# Patient Record
Sex: Male | Born: 1955 | Race: Black or African American | Hispanic: No | Marital: Married | Smoking: Never smoker
Health system: Southern US, Community
[De-identification: ages and names within clinical notes are randomized; demographics above are authoritative.]

## PROBLEM LIST (undated history)

## (undated) DIAGNOSIS — G4730 Sleep apnea, unspecified: Secondary | ICD-10-CM

## (undated) DIAGNOSIS — T7840XA Allergy, unspecified, initial encounter: Secondary | ICD-10-CM

## (undated) HISTORY — PX: OTHER SURGICAL HISTORY: SHX169

## (undated) HISTORY — DX: Sleep apnea, unspecified: G47.30

## (undated) HISTORY — DX: Allergy, unspecified, initial encounter: T78.40XA

---

## 2015-08-28 DIAGNOSIS — R42 Dizziness and giddiness: Secondary | ICD-10-CM | POA: Diagnosis not present

## 2016-02-01 DIAGNOSIS — G4733 Obstructive sleep apnea (adult) (pediatric): Secondary | ICD-10-CM | POA: Diagnosis not present

## 2016-04-25 DIAGNOSIS — G4733 Obstructive sleep apnea (adult) (pediatric): Secondary | ICD-10-CM | POA: Diagnosis not present

## 2017-03-06 DIAGNOSIS — E785 Hyperlipidemia, unspecified: Secondary | ICD-10-CM | POA: Diagnosis not present

## 2017-03-06 DIAGNOSIS — R0789 Other chest pain: Secondary | ICD-10-CM | POA: Diagnosis not present

## 2017-03-06 DIAGNOSIS — I200 Unstable angina: Secondary | ICD-10-CM | POA: Diagnosis not present

## 2017-03-06 DIAGNOSIS — Z9114 Patient's other noncompliance with medication regimen: Secondary | ICD-10-CM | POA: Diagnosis not present

## 2017-03-06 DIAGNOSIS — I10 Essential (primary) hypertension: Secondary | ICD-10-CM | POA: Diagnosis not present

## 2017-03-06 DIAGNOSIS — N400 Enlarged prostate without lower urinary tract symptoms: Secondary | ICD-10-CM | POA: Diagnosis not present

## 2017-03-06 DIAGNOSIS — R079 Chest pain, unspecified: Secondary | ICD-10-CM | POA: Diagnosis not present

## 2017-04-20 ENCOUNTER — Ambulatory Visit (INDEPENDENT_AMBULATORY_CARE_PROVIDER_SITE_OTHER): Payer: Medicare Other | Admitting: Nurse Practitioner

## 2017-04-20 ENCOUNTER — Encounter: Payer: Self-pay | Admitting: Nurse Practitioner

## 2017-04-20 VITALS — BP 120/80 | HR 72 | Resp 16 | Ht 64.0 in | Wt 219.6 lb

## 2017-04-20 DIAGNOSIS — G4733 Obstructive sleep apnea (adult) (pediatric): Secondary | ICD-10-CM

## 2017-04-20 NOTE — Progress Notes (Addendum)
[facility] [address] [place], [place] [address]  Internal MEDICINE  Office Visit Note  Patient Name: Bradley Gallagher  [id number]  [id number]  Date of Service: 04/26/2017  Chief Complaint  Patient presents with  . Apnea    uses CPAP at  [facility]    Other  This is a chronic problem. The current episode started more than 1 year ago. The problem occurs constantly. The problem has been gradually improving. Pertinent negatives include no fatigue. Nothing aggravates the symptoms. Treatments tried: CPAP. The treatment provided significant relief.    Pt is here for routine follow up.    Current Medication: Outpatient Encounter Medications as of 04/20/2017  Medication Sig  . cholecalciferol (VITAMIN D) 1000 units tablet Take 1,000 Units by mouth daily.  Marland Kitchen FLUoxetine (PROZAC) 20 MG tablet Take 20 mg by mouth daily.  . simvastatin (ZOCOR) 5 MG tablet Take 5 mg by mouth daily.  . tamsulosin (FLOMAX) 0.4 MG CAPS capsule Take 0.4 mg by mouth. Take 1 tab po daily   No facility-administered encounter medications on file as of 04/20/2017.     Surgical History: History reviewed. No pertinent surgical history.  Medical History: Past Medical History:  Diagnosis Date  . Allergy   . Sleep apnea     Family History: Family History  Problem Relation Age of Onset  . Colon cancer Sister     Social History   Socioeconomic History  . Marital status: Married    Spouse name: Not on file  . Number of children: Not on file  . Years of education: Not on file  . Highest education level: Not on file  Social Needs  . Financial resource strain: Not on file  . Food insecurity - worry: Not on file  . Food insecurity - inability: Not on file  . Transportation needs - medical: Not on file  . Transportation needs - non-medical: Not on file  Occupational History  . Not on file  Tobacco Use  . Smoking status: Never Smoker  . Smokeless tobacco: Never Used  Substance and Sexual Activity   . Alcohol use: No    Frequency: Never  . Drug use: No  . Sexual activity: Not on file  Other Topics Concern  . Not on file  Social History Narrative  . Not on file      Review of Systems  Constitutional: Negative for activity change and fatigue.  HENT: Negative.   Respiratory: Positive for apnea.   Gastrointestinal: Negative.   Endocrine: Negative.   Genitourinary: Negative.   Musculoskeletal: Negative.   Skin: Negative.   Neurological: Negative.   Psychiatric/Behavioral: Negative.     Today's Vitals   04/20/17 1148  BP: 120/80  Pulse: 72  Resp: 16  SpO2: 98%  Weight: 219 lb 9.6 oz (99.6 kg)  Height: 5\' 4"  (1.626 m)    Physical Exam  Constitutional: He is oriented to person, place, and time. He appears well-developed and well-nourished. No distress.  HENT:  Head: Normocephalic and atraumatic.  Mouth/Throat: Oropharynx is clear and moist. No oropharyngeal exudate.  Eyes: EOM are normal. Pupils are equal, round, and reactive to light.  Neck: Normal range of motion. Neck supple. No JVD present. Carotid bruit is not present. No tracheal deviation present. No thyromegaly present.  Cardiovascular: Normal rate, regular rhythm and normal heart sounds. Exam reveals no gallop and no friction rub.  No murmur heard. Pulmonary/Chest: Effort normal and breath sounds normal. No respiratory distress. He has no wheezes. He  has no rales. He exhibits no tenderness.  Abdominal: Soft. Bowel sounds are normal. There is no tenderness.  Musculoskeletal: Normal range of motion.  Lymphadenopathy:    He has no cervical adenopathy.  Neurological: He is alert and oriented to person, place, and time. No cranial nerve deficit.  Skin: Skin is warm and dry. He is not diaphoretic.  Psychiatric: He has a normal mood and affect. His behavior is normal. Judgment and thought content normal.  Nursing note and vitals reviewed.   Assessment/Plan:  1. Obstructive sleep apnea Doing well. Patient  should continue on CPAP of +7cmH2O  General Counseling: Bradley Gallagher verbalizes understanding of the findings of todays visit and agrees with plan of treatment. I have discussed any further diagnostic evaluation that may be needed or ordered today. We also reviewed his medications today. he has been encouraged to call the office with any questions or concerns that should arise related to todays visit.  This patient was seen by Vincent GrosHeather Ayjah Show, FNP- C in Collaboration with Dr Lyndon CodeFozia M Khan as a part of collaborative care agreement    Time spent: 15 Minutes     Dr Lyndon CodeFozia M Khan Internal medicine

## 2017-04-26 DIAGNOSIS — G4733 Obstructive sleep apnea (adult) (pediatric): Secondary | ICD-10-CM | POA: Insufficient documentation

## 2017-06-15 DIAGNOSIS — R0789 Other chest pain: Secondary | ICD-10-CM | POA: Diagnosis not present

## 2017-06-15 DIAGNOSIS — K219 Gastro-esophageal reflux disease without esophagitis: Secondary | ICD-10-CM | POA: Diagnosis not present

## 2017-06-15 DIAGNOSIS — R079 Chest pain, unspecified: Secondary | ICD-10-CM | POA: Diagnosis not present

## 2017-06-15 DIAGNOSIS — I10 Essential (primary) hypertension: Secondary | ICD-10-CM | POA: Diagnosis not present

## 2017-09-04 ENCOUNTER — Encounter: Payer: Self-pay | Admitting: Internal Medicine

## 2017-09-22 ENCOUNTER — Ambulatory Visit (INDEPENDENT_AMBULATORY_CARE_PROVIDER_SITE_OTHER): Payer: Medicare Other | Admitting: Nurse Practitioner

## 2017-09-22 ENCOUNTER — Encounter: Payer: Self-pay | Admitting: Nurse Practitioner

## 2017-09-22 VITALS — BP 122/79 | HR 74 | Resp 16 | Ht 71.0 in | Wt 204.4 lb

## 2017-09-22 DIAGNOSIS — Z0001 Encounter for general adult medical examination with abnormal findings: Secondary | ICD-10-CM | POA: Diagnosis not present

## 2017-09-22 DIAGNOSIS — F4310 Post-traumatic stress disorder, unspecified: Secondary | ICD-10-CM

## 2017-09-22 DIAGNOSIS — I10 Essential (primary) hypertension: Secondary | ICD-10-CM

## 2017-09-22 DIAGNOSIS — R300 Dysuria: Secondary | ICD-10-CM

## 2017-09-22 DIAGNOSIS — G4733 Obstructive sleep apnea (adult) (pediatric): Secondary | ICD-10-CM

## 2017-09-22 NOTE — Progress Notes (Signed)
[facility] [address] [place], [place] [address]  Internal MEDICINE  Office Visit Note  Patient Name: Bradley Gallagher  [id number]  [id number]  Date of Service: 10/15/2017   Pt is here for routine health maintenance examination  Chief Complaint  Patient presents with  . Annual Exam  . Hypertension     The patient is seen on routine basis at [place] due to PTSD. His routine labs are checked at [facility] and he has brought his results for me to review and put into his chart.   Hypertension  This is a chronic problem. The current episode started more than 1 year ago. The problem is unchanged. The problem is controlled. Pertinent negatives include no chest pain, headaches, palpitations or shortness of breath. There are no associated agents to hypertension. Risk factors for coronary artery disease include dyslipidemia and male gender. Past treatments include alpha 1 blockers. The current treatment provides moderate improvement. There are no compliance problems.      Current Medication: Outpatient Encounter Medications as of 09/22/2017  Medication Sig  . ARIPiprazole (ABILIFY) 10 MG tablet Take 10 mg by mouth daily.  Marland Kitchen aspirin (ASPIRIN LOW DOSE) 81 MG tablet Take 81 mg by mouth daily.  . baclofen (LIORESAL) 10 MG tablet Take 10 mg by mouth daily as needed for muscle spasms.  . cholecalciferol (VITAMIN D) 1000 units tablet Take 1,000 Units by mouth daily.  Marland Kitchen Dexlansoprazole (DEXILANT) 30 MG capsule Take 30 mg by mouth daily.  Marland Kitchen escitalopram (LEXAPRO) 20 MG tablet Take 20 mg by mouth daily.  Marland Kitchen FLUoxetine (PROZAC) 20 MG tablet Take 20 mg by mouth daily.  Marland Kitchen lisinopril (PRINIVIL,ZESTRIL) 20 MG tablet Take 20 mg by mouth daily.  . prazosin (MINIPRESS) 5 MG capsule Take 5 mg by mouth at bedtime.  . simvastatin (ZOCOR) 80 MG tablet Take 80 mg by mouth daily.  . tamsulosin (FLOMAX) 0.4 MG CAPS capsule Take 0.4 mg by mouth. Take 1 tab po daily  . traZODone (DESYREL) 100 MG tablet Take 100 mg  by mouth at bedtime.  . [DISCONTINUED] simvastatin (ZOCOR) 5 MG tablet Take 5 mg by mouth daily.   No facility-administered encounter medications on file as of 09/22/2017.     Surgical History: Past Surgical History:  Procedure Laterality Date  . implant in mouth      Medical History: Past Medical History:  Diagnosis Date  . Allergy   . Sleep apnea     Family History: Family History  Problem Relation Age of Onset  . Colon cancer Sister   . Heart attack Mother   . Dementia Mother       Review of Systems  Constitutional: Negative for activity change, fatigue and unexpected weight change.  HENT: Negative for postnasal drip, rhinorrhea, sinus pressure, sinus pain and sore throat.   Eyes: Negative.   Respiratory: Positive for apnea. Negative for shortness of breath and wheezing.        Currently on CPAP   Cardiovascular: Negative for chest pain and palpitations.  Gastrointestinal: Negative for constipation, diarrhea, nausea and vomiting.  Endocrine: Negative for cold intolerance, heat intolerance, polydipsia, polyphagia and polyuria.  Genitourinary: Negative.   Musculoskeletal: Negative for arthralgias, back pain and myalgias.  Skin: Negative.   Allergic/Immunologic: Negative for environmental allergies.  Neurological: Negative for dizziness and headaches.  Hematological: Negative for adenopathy.  Psychiatric/Behavioral: Negative for agitation, confusion, dysphoric mood and sleep disturbance. The patient is not nervous/anxious.      Today's Vitals   09/22/17  0938  BP: 122/79  Pulse: 74  Resp: 16  SpO2: 97%  Weight: 204 lb 6.4 oz (92.7 kg)  Height: 5\' 11"  (1.803 m)    Physical Exam  Constitutional: He is oriented to person, place, and time. He appears well-developed and well-nourished. No distress.  HENT:  Head: Normocephalic and atraumatic.  Nose: Nose normal.  Mouth/Throat: Oropharynx is clear and moist. No oropharyngeal exudate.  Eyes: Pupils are equal,  round, and reactive to light. Conjunctivae and EOM are normal.  Neck: Normal range of motion. Neck supple. No JVD present. Carotid bruit is not present. No tracheal deviation present. No thyromegaly present.  Cardiovascular: Normal rate, regular rhythm, normal heart sounds and intact distal pulses. Exam reveals no gallop and no friction rub.  No murmur heard. Pulmonary/Chest: Effort normal and breath sounds normal. No respiratory distress. He has no wheezes. He has no rales. He exhibits no tenderness.  Abdominal: Soft. Bowel sounds are normal. There is no tenderness.  Musculoskeletal: Normal range of motion.  Lymphadenopathy:    He has no cervical adenopathy.  Neurological: He is alert and oriented to person, place, and time. No cranial nerve deficit.  Skin: Skin is warm and dry. Capillary refill takes less than 2 seconds. He is not diaphoretic.  Psychiatric: He has a normal mood and affect. His behavior is normal. Judgment and thought content normal.  Nursing note and vitals reviewed.  LABS: Recent Results (from the past 2160 hour(s))  Urinalysis, Routine w reflex microscopic     Status: None   Collection Time: 09/22/17 10:54 AM  Result Value Ref Range   Specific Gravity, UA 1.025 1.005 - 1.030   pH, UA 5.5 5.0 - 7.5   Color, UA Yellow Yellow   Appearance Ur Clear Clear   Leukocytes, UA Negative Negative   Protein, UA Negative Negative/Trace   Glucose, UA Negative Negative   Ketones, UA Negative Negative   RBC, UA Negative Negative   Bilirubin, UA Negative Negative   Urobilinogen, Ur 0.2 0.2 - 1.0 mg/dL   Nitrite, UA Negative Negative   Microscopic Examination Comment     Comment: Microscopic not indicated and not performed.    Assessment/Plan: 1. Encounter for general adult medical examination with abnormal findings Annual health maintenance exam today.  2. Essential hypertension, benign Stable. Continue bp medication as prescribed.   3. Obstructive sleep apnea Continue  CPAP and regular visits with Dr. Freda Munro for CPAP management  4. [facility] traumatic stress disorder [facility]  5. Dysuria - Urinalysis, Routine w reflex microscopic  General Counseling: Cathan verbalizes understanding of the findings of todays visit and agrees with plan of treatment. I have discussed any further diagnostic evaluation that may be needed or ordered today. We also reviewed his medications today. he has been encouraged to call the office with any questions or concerns that should arise related to todays visit.    Counseling:  Hypertension Counseling:   The following hypertensive lifestyle modification were recommended and discussed:  1. Limiting alcohol intake to less than 1 oz/day of ethanol:(24 oz of beer or 8 oz of wine or 2 oz of 100-proof whiskey). 2. Take baby ASA 81 mg daily. 3. Importance of regular aerobic exercise and losing weight. 4. Reduce dietary saturated fat and cholesterol intake for overall cardiovascular health. 5. Maintaining adequate dietary potassium, calcium, and magnesium intake. 6. Regular monitoring of the blood pressure. 7. Reduce sodium intake to less than 100 mmol/day (less than  2.3 gm of sodium or less than 6 gm of sodium choride)   This patient was seen by Vincent GrosHeather Kelyn Ponciano FNP Collaboration with Dr Lyndon CodeFozia M Khan as a part of collaborative care agreement   Orders Placed This Encounter  Procedures  . Urinalysis, Routine w reflex microscopic      Time spent: 2030 Minutes      Lyndon CodeFozia M Khan, MD  Internal Medicine

## 2017-09-23 LAB — URINALYSIS, ROUTINE W REFLEX MICROSCOPIC
Bilirubin, UA: NEGATIVE
GLUCOSE, UA: NEGATIVE
KETONES UA: NEGATIVE
Leukocytes, UA: NEGATIVE
NITRITE UA: NEGATIVE
Protein, UA: NEGATIVE
RBC UA: NEGATIVE
SPEC GRAV UA: 1.025 (ref 1.005–1.030)
UUROB: 0.2 mg/dL (ref 0.2–1.0)
pH, UA: 5.5 (ref 5.0–7.5)

## 2017-10-15 DIAGNOSIS — I10 Essential (primary) hypertension: Secondary | ICD-10-CM | POA: Insufficient documentation

## 2017-10-15 DIAGNOSIS — F4310 Post-traumatic stress disorder, unspecified: Secondary | ICD-10-CM | POA: Insufficient documentation

## 2017-10-15 DIAGNOSIS — Z0001 Encounter for general adult medical examination with abnormal findings: Secondary | ICD-10-CM | POA: Insufficient documentation

## 2017-10-15 DIAGNOSIS — R300 Dysuria: Secondary | ICD-10-CM | POA: Insufficient documentation

## 2018-02-12 ENCOUNTER — Encounter: Payer: Self-pay | Admitting: Adult Health

## 2018-02-12 ENCOUNTER — Ambulatory Visit (INDEPENDENT_AMBULATORY_CARE_PROVIDER_SITE_OTHER): Payer: Medicare Other | Admitting: Adult Health

## 2018-02-12 VITALS — BP 130/86 | HR 70 | Resp 16 | Ht 71.0 in | Wt 210.0 lb

## 2018-02-12 DIAGNOSIS — I10 Essential (primary) hypertension: Secondary | ICD-10-CM

## 2018-02-12 DIAGNOSIS — G4733 Obstructive sleep apnea (adult) (pediatric): Secondary | ICD-10-CM

## 2018-02-12 DIAGNOSIS — Z9989 Dependence on other enabling machines and devices: Secondary | ICD-10-CM

## 2018-02-12 DIAGNOSIS — F4310 Post-traumatic stress disorder, unspecified: Secondary | ICD-10-CM | POA: Diagnosis not present

## 2018-02-12 NOTE — Progress Notes (Signed)
[facility] [address] [place], [place] [address]  Pulmonary Sleep Medicine   Office Visit Note  Patient Name: Bradley Gallagher DOB: [birth year] MRN [id number]  Date of Service: 02/12/2018  Complaints/HPI: Pt is here for follow up on OSA.  He reports he wears his CPAP every night. He reports excellent control of his symptoms. He denies excessive sleepiness, fatigue or headaches. He states he went on an overnight trip a few months back, and forgot to bring his CPAP.  He states he felt sleepy and fatigued. He has not forgotten his machine since.  He is very happy with hist treatment and reports he feels much better.  He is cleaning his machine with soap and water. He is in need of a new RX for supplies.       ROS  General: (-) fever, (-) chills, (-) night sweats, (-) weakness Skin: (-) rashes, (-) itching,. Eyes: (-) visual changes, (-) redness, (-) itching. Nose and Sinuses: (-) nasal stuffiness or itchiness, (-) postnasal drip, (-) nosebleeds, (-) sinus trouble. Mouth and Throat: (-) sore throat, (-) hoarseness. Neck: (-) swollen glands, (-) enlarged thyroid, (-) neck pain. Respiratory: - cough, (-) bloody sputum, - shortness of breath, - wheezing. Cardiovascular: - ankle swelling, (-) chest pain. Lymphatic: (-) lymph node enlargement. Neurologic: (-) numbness, (-) tingling. Psychiatric: (-) anxiety, (-) depression   Current Medication: Outpatient Encounter Medications as of 02/12/2018  Medication Sig  . ARIPiprazole (ABILIFY) 10 MG tablet Take 10 mg by mouth daily.  Marland Kitchen aspirin (ASPIRIN LOW DOSE) 81 MG tablet Take 81 mg by mouth daily.  . baclofen (LIORESAL) 10 MG tablet Take 10 mg by mouth daily as needed for muscle spasms.  . cholecalciferol (VITAMIN D) 1000 units tablet Take 1,000 Units by mouth daily.  Marland Kitchen Dexlansoprazole (DEXILANT) 30 MG capsule Take 30 mg by mouth daily.  Marland Kitchen escitalopram (LEXAPRO) 20 MG tablet Take 20 mg by mouth daily.  Marland Kitchen FLUoxetine (PROZAC) 20 MG  tablet Take 20 mg by mouth daily.  Marland Kitchen lisinopril (PRINIVIL,ZESTRIL) 20 MG tablet Take 20 mg by mouth daily.  . prazosin (MINIPRESS) 5 MG capsule Take 5 mg by mouth at bedtime.  . simvastatin (ZOCOR) 80 MG tablet Take 80 mg by mouth daily.  . tamsulosin (FLOMAX) 0.4 MG CAPS capsule Take 0.4 mg by mouth. Take 1 tab po daily  . traZODone (DESYREL) 100 MG tablet Take 100 mg by mouth at bedtime.   No facility-administered encounter medications on file as of 02/12/2018.     Surgical History: Past Surgical History:  Procedure Laterality Date  . implant in mouth      Medical History: Past Medical History:  Diagnosis Date  . Allergy   . Sleep apnea     Family History: Family History  Problem Relation Age of Onset  . Colon cancer Sister   . Heart attack Mother   . Dementia Mother     Social History: Social History   Socioeconomic History  . Marital status: Married    Spouse name: Not on file  . Number of children: Not on file  . Years of education: Not on file  . Highest education level: Not on file  Occupational History  . Not on file  Social Needs  . Financial resource strain: Not on file  . Food insecurity:    Worry: Not on file    Inability: Not on file  . Transportation needs:    Medical: Not on file    Non-medical: Not on  file  Tobacco Use  . Smoking status: Never Smoker  . Smokeless tobacco: Never Used  Substance and Sexual Activity  . Alcohol use: No    Frequency: Never  . Drug use: No  . Sexual activity: Not on file  Lifestyle  . Physical activity:    Days per week: Not on file    Minutes per session: Not on file  . Stress: Not on file  Relationships  . Social connections:    Talks on phone: Not on file    Gets together: Not on file    Attends religious service: Not on file    Active member of club or organization: Not on file    Attends meetings of clubs or organizations: Not on file    Relationship status: Not on file  . Intimate partner  violence:    Fear of current or ex partner: Not on file    Emotionally abused: Not on file    Physically abused: Not on file    Forced sexual activity: Not on file  Other Topics Concern  . Not on file  Social History Narrative  . Not on file    Vital Signs: Blood pressure 130/86, pulse 70, resp. rate 16, height 5\' 11"  (1.803 m), weight 210 lb (95.3 kg), SpO2 98 %.  Examination: General Appearance: The patient is well-developed, well-nourished, and in no distress. Skin: Gross inspection of skin unremarkable. Head: normocephalic, no gross deformities. Eyes: no gross deformities noted. ENT: ears appear grossly normal no exudates. Neck: Supple. No thyromegaly. No LAD. Respiratory: Clear to auscultation bilat. Cardiovascular: Normal S1 and S2 without murmur or rub. Extremities: No cyanosis. pulses are equal. Neurologic: Alert and oriented. No involuntary movements.  LABS: No results found for this or any previous visit (from the past 2160 hour(s)).  Radiology: Patient was never admitted.  No results found.  No results found.    Assessment and Plan: Patient Active Problem List   Diagnosis Date Noted  . Encounter for general adult medical examination with abnormal findings 10/15/2017  . Essential hypertension, benign 10/15/2017  . Post traumatic stress disorder 10/15/2017  . Dysuria 10/15/2017  . Obstructive sleep apnea 04/26/2017    1. OSA on CPAP Encouraged patient to continue to use CPAP.  Will send RX for new supplies. Follow up in 6 months.   2. Essential hypertension, benign Stable, continue current medications  3. [facility]   General Counseling: I have discussed the findings of the evaluation and examination with Bradley Gallagher.  I have also discussed any further diagnostic evaluation thatmay be needed or ordered today. Bradley Gallagher verbalizes understanding of the findings of todays visit. We also reviewed his medications  today and discussed drug interactions and side effects including but not limited excessive drowsiness and altered mental states. We also discussed that there is always a risk not just to him but also people around him. he has been encouraged to call the office with any questions or concerns that should arise related to todays visit.    Time spent: 25 This patient was seen by Blima LedgerAdam Zoelle Markus AGNP-C in Collaboration with Dr. Freda MunroSaadat Khan as a part of collaborative care agreement.   I have personally obtained a history, examined the patient, evaluated laboratory and imaging results, formulated the assessment and plan and placed orders.    Yevonne PaxSaadat A Khan, MD [facility] Pulmonary and Critical Care Sleep medicine

## 2018-02-12 NOTE — Patient Instructions (Signed)

## 2018-04-20 ENCOUNTER — Ambulatory Visit (INDEPENDENT_AMBULATORY_CARE_PROVIDER_SITE_OTHER): Payer: Medicare Other | Admitting: Adult Health

## 2018-04-20 ENCOUNTER — Encounter: Payer: Self-pay | Admitting: Nurse Practitioner

## 2018-04-20 VITALS — BP 130/80 | HR 78 | Resp 16 | Ht 71.0 in | Wt 217.0 lb

## 2018-04-20 DIAGNOSIS — G4733 Obstructive sleep apnea (adult) (pediatric): Secondary | ICD-10-CM

## 2018-04-20 DIAGNOSIS — Z9989 Dependence on other enabling machines and devices: Secondary | ICD-10-CM | POA: Diagnosis not present

## 2018-04-20 DIAGNOSIS — F4310 Post-traumatic stress disorder, unspecified: Secondary | ICD-10-CM | POA: Diagnosis not present

## 2018-04-20 DIAGNOSIS — I10 Essential (primary) hypertension: Secondary | ICD-10-CM

## 2018-04-20 NOTE — Patient Instructions (Signed)

## 2018-04-20 NOTE — Progress Notes (Signed)
[facility] [address] [place], [place] [address]  Pulmonary Sleep Medicine   Office Visit Note  Patient Name: Bradley Gallagher DOB: 01/28/1956 MRN [id number]  Date of Service: 04/20/2018  Complaints/HPI: Pt is here for follow up on osa.  He reports excellent control of his OSA symptoms using his CPAP.  He is religiously using his CPAP every night.  He is cleaning his dream but reports that it is very time consuming and he is ready to buy a so clean machine to assist him with this.  He changes supplies out as directed and overall is very happy with the results from his sectioning.  He denies any sinus issues or infections.  He denies any chest pain, dizziness, or shortness of breath.  ROS  General: (-) fever, (-) chills, (-) night sweats, (-) weakness Skin: (-) rashes, (-) itching,. Eyes: (-) visual changes, (-) redness, (-) itching. Nose and Sinuses: (-) nasal stuffiness or itchiness, (-) postnasal drip, (-) nosebleeds, (-) sinus trouble. Mouth and Throat: (-) sore throat, (-) hoarseness. Neck: (-) swollen glands, (-) enlarged thyroid, (-) neck pain. Respiratory: - cough, (-) bloody sputum, - shortness of breath, - wheezing. Cardiovascular: - ankle swelling, (-) chest pain. Lymphatic: (-) lymph node enlargement. Neurologic: (-) numbness, (-) tingling. Psychiatric: (-) anxiety, (-) depression   Current Medication: Outpatient Encounter Medications as of 04/20/2018  Medication Sig  . ARIPiprazole (ABILIFY) 10 MG tablet Take 10 mg by mouth daily.  Marland Kitchen aspirin (ASPIRIN LOW DOSE) 81 MG tablet Take 81 mg by mouth daily.  . cholecalciferol (VITAMIN D) 1000 units tablet Take 1,000 Units by mouth daily.  Marland Kitchen Dexlansoprazole (DEXILANT) 30 MG capsule Take 30 mg by mouth daily.  Marland Kitchen escitalopram (LEXAPRO) 20 MG tablet Take 20 mg by mouth daily.  Marland Kitchen FLUoxetine (PROZAC) 20 MG tablet Take 20 mg by mouth daily.  Marland Kitchen lisinopril (PRINIVIL,ZESTRIL) 20 MG tablet Take 20 mg by mouth daily.  .  prazosin (MINIPRESS) 5 MG capsule Take 5 mg by mouth at bedtime.  . simvastatin (ZOCOR) 80 MG tablet Take 80 mg by mouth daily.  . tamsulosin (FLOMAX) 0.4 MG CAPS capsule Take 0.4 mg by mouth. Take 1 tab po daily  . traZODone (DESYREL) 100 MG tablet Take 100 mg by mouth at bedtime.  . baclofen (LIORESAL) 10 MG tablet Take 10 mg by mouth daily as needed for muscle spasms.   No facility-administered encounter medications on file as of 04/20/2018.     Surgical History: Past Surgical History:  Procedure Laterality Date  . implant in mouth      Medical History: Past Medical History:  Diagnosis Date  . Allergy   . Sleep apnea     Family History: Family History  Problem Relation Age of Onset  . Colon cancer Sister   . Heart attack Mother   . Dementia Mother     Social History: Social History   Socioeconomic History  . Marital status: Married    Spouse name: Not on file  . Number of children: Not on file  . Years of education: Not on file  . Highest education level: Not on file  Occupational History  . Not on file  Social Needs  . Financial resource strain: Not on file  . Food insecurity:    Worry: Not on file    Inability: Not on file  . Transportation needs:    Medical: Not on file    Non-medical: Not on file  Tobacco Use  . Smoking status:  Never Smoker  . Smokeless tobacco: Never Used  Substance and Sexual Activity  . Alcohol use: No    Frequency: Never  . Drug use: No  . Sexual activity: Not on file  Lifestyle  . Physical activity:    Days per week: Not on file    Minutes per session: Not on file  . Stress: Not on file  Relationships  . Social connections:    Talks on phone: Not on file    Gets together: Not on file    Attends religious service: Not on file    Active member of club or organization: Not on file    Attends meetings of clubs or organizations: Not on file    Relationship status: Not on file  . Intimate partner violence:    Fear of current  or ex partner: Not on file    Emotionally abused: Not on file    Physically abused: Not on file    Forced sexual activity: Not on file  Other Topics Concern  . Not on file  Social History Narrative  . Not on file    Vital Signs: Blood pressure 130/80, pulse 78, resp. rate 16, height 5\' 11"  (1.803 m), weight 217 lb (98.4 kg), SpO2 98 %.  Examination: General Appearance: The patient is well-developed, well-nourished, and in no distress. Skin: Gross inspection of skin unremarkable. Head: normocephalic, no gross deformities. Eyes: no gross deformities noted. ENT: ears appear grossly normal no exudates. Neck: Supple. No thyromegaly. No LAD. Respiratory: clear bilaterally. Cardiovascular: Normal S1 and S2 without murmur or rub. Extremities: No cyanosis. pulses are equal. Neurologic: Alert and oriented. No involuntary movements.  LABS: No results found for this or any previous visit (from the past 2160 hour(s)).  Radiology: Patient was never admitted.  No results found.  No results found.    Assessment and Plan: Patient Active Problem List   Diagnosis Date Noted  . Encounter for general adult medical examination with abnormal findings 10/15/2017  . Essential hypertension, benign 10/15/2017  . Post traumatic stress disorder 10/15/2017  . Dysuria 10/15/2017  . Obstructive sleep apnea 04/26/2017    1. OSA on CPAP Continue CPAP nightly and napping.  Continue to clean machine and what ever modality is best for him.  Also encourage patient to continue changing supplies as directed.  Patient follow-up in 1 year.  2. Essential hypertension, benign Stable, continue current therapy.  3. Post traumatic stress disorder Stable, continue CVA.  General Counseling: I have discussed the findings of the evaluation and examination with Kevin Fenton.  I have also discussed any further diagnostic evaluation thatmay be needed or ordered today. Ziya verbalizes understanding of the findings of  todays visit. We also reviewed his medications today and discussed drug interactions and side effects including but not limited excessive drowsiness and altered mental states. We also discussed that there is always a risk not just to him but also people around him. he has been encouraged to call the office with any questions or concerns that should arise related to todays visit.    Time spent: 25  I have personally obtained a history, examined the patient, evaluated laboratory and imaging results, formulated the assessment and plan and placed orders.    Yevonne Pax, MD [facility] Pulmonary and Critical Care Sleep medicine

## 2018-05-02 DIAGNOSIS — I10 Essential (primary) hypertension: Secondary | ICD-10-CM | POA: Diagnosis not present

## 2018-05-02 DIAGNOSIS — R29700 NIHSS score 0: Secondary | ICD-10-CM | POA: Diagnosis not present

## 2018-05-02 DIAGNOSIS — K219 Gastro-esophageal reflux disease without esophagitis: Secondary | ICD-10-CM | POA: Diagnosis not present

## 2018-05-02 DIAGNOSIS — R51 Headache: Secondary | ICD-10-CM | POA: Diagnosis not present

## 2018-07-02 DIAGNOSIS — J309 Allergic rhinitis, unspecified: Secondary | ICD-10-CM | POA: Diagnosis not present

## 2018-08-13 ENCOUNTER — Ambulatory Visit: Payer: Self-pay | Admitting: Adult Health

## 2018-09-27 DIAGNOSIS — E785 Hyperlipidemia, unspecified: Secondary | ICD-10-CM | POA: Diagnosis not present

## 2018-09-27 DIAGNOSIS — R079 Chest pain, unspecified: Secondary | ICD-10-CM | POA: Diagnosis not present

## 2018-09-27 DIAGNOSIS — I10 Essential (primary) hypertension: Secondary | ICD-10-CM | POA: Diagnosis not present

## 2018-09-27 DIAGNOSIS — R072 Precordial pain: Secondary | ICD-10-CM | POA: Diagnosis not present

## 2018-12-26 DIAGNOSIS — M62838 Other muscle spasm: Secondary | ICD-10-CM | POA: Diagnosis not present

## 2018-12-26 DIAGNOSIS — F17210 Nicotine dependence, cigarettes, uncomplicated: Secondary | ICD-10-CM | POA: Diagnosis not present

## 2018-12-26 DIAGNOSIS — R079 Chest pain, unspecified: Secondary | ICD-10-CM | POA: Diagnosis not present

## 2018-12-26 DIAGNOSIS — I10 Essential (primary) hypertension: Secondary | ICD-10-CM | POA: Diagnosis not present

## 2019-01-30 ENCOUNTER — Ambulatory Visit (INDEPENDENT_AMBULATORY_CARE_PROVIDER_SITE_OTHER): Payer: Medicare Other

## 2019-01-30 ENCOUNTER — Other Ambulatory Visit: Payer: Self-pay

## 2019-01-30 DIAGNOSIS — G4733 Obstructive sleep apnea (adult) (pediatric): Secondary | ICD-10-CM | POA: Diagnosis not present

## 2019-01-30 NOTE — Progress Notes (Signed)
95 percentile pressure 7   95th percentile leak 5.8   apnea index 0.3 /hr  apnea-hypopnea index  0.6 /hr   total days used  >4 hr 64 days  total days used <4 hr 0 days  Total compliance 71 percent  He is doing great wearing cpap encouraged to wear a little more compliance is borderline at 71%

## 2019-02-14 ENCOUNTER — Telehealth: Payer: Self-pay

## 2019-02-14 NOTE — Telephone Encounter (Signed)
Called lmom informing patient of appointment. [facility] 

## 2019-02-14 NOTE — Telephone Encounter (Signed)
Pt called and confirmed appt for 02/18/19. Bradley Gallagher

## 2019-02-18 ENCOUNTER — Other Ambulatory Visit: Payer: Self-pay

## 2019-02-18 ENCOUNTER — Encounter: Payer: Self-pay | Admitting: Internal Medicine

## 2019-02-18 ENCOUNTER — Ambulatory Visit (INDEPENDENT_AMBULATORY_CARE_PROVIDER_SITE_OTHER): Payer: Medicare Other | Admitting: Internal Medicine

## 2019-02-18 VITALS — BP 140/80 | HR 85 | Temp 97.7°F | Resp 16 | Ht 71.0 in | Wt 215.0 lb

## 2019-02-18 DIAGNOSIS — I10 Essential (primary) hypertension: Secondary | ICD-10-CM

## 2019-02-18 DIAGNOSIS — Z9989 Dependence on other enabling machines and devices: Secondary | ICD-10-CM

## 2019-02-18 DIAGNOSIS — F4310 Post-traumatic stress disorder, unspecified: Secondary | ICD-10-CM

## 2019-02-18 DIAGNOSIS — G4733 Obstructive sleep apnea (adult) (pediatric): Secondary | ICD-10-CM | POA: Diagnosis not present

## 2019-02-18 NOTE — Progress Notes (Signed)
[facility] [address] [place], [place] [address]  Pulmonary Sleep Medicine   Office Visit Note  Patient Name: Bradley Gallagher DOB: 1955/05/12 MRN [id number]  Date of Service: 02/18/2019  Complaints/HPI: Pt is here for one year follow up.  His recent compliance shows 71%.  He does report taking a day off here and there.  We discussed the importance of compliance. He is cleaning machine by hand, and changing filters and tubing as directed. Denies headaches, sinus issues, palpitations, or hemoptysis.  He is in need of a new RX for supplies.   ROS  General: (-) fever, (-) chills, (-) night sweats, (-) weakness Skin: (-) rashes, (-) itching,. Eyes: (-) visual changes, (-) redness, (-) itching. Nose and Sinuses: (-) nasal stuffiness or itchiness, (-) postnasal drip, (-) nosebleeds, (-) sinus trouble. Mouth and Throat: (-) sore throat, (-) hoarseness. Neck: (-) swollen glands, (-) enlarged thyroid, (-) neck pain. Respiratory: - cough, (-) bloody sputum, - shortness of breath, - wheezing. Cardiovascular: - ankle swelling, (-) chest pain. Lymphatic: (-) lymph node enlargement. Neurologic: (-) numbness, (-) tingling. Psychiatric: (-) anxiety, (-) depression   Current Medication: Outpatient Encounter Medications as of 02/18/2019  Medication Sig  . ARIPiprazole (ABILIFY) 10 MG tablet Take 10 mg by mouth daily.  Marland Kitchen aspirin (ASPIRIN LOW DOSE) 81 MG tablet Take 81 mg by mouth daily.  . cholecalciferol (VITAMIN D) 1000 units tablet Take 1,000 Units by mouth daily.  Marland Kitchen Dexlansoprazole (DEXILANT) 30 MG capsule Take 30 mg by mouth daily.  Marland Kitchen escitalopram (LEXAPRO) 20 MG tablet Take 20 mg by mouth daily.  Marland Kitchen lisinopril (PRINIVIL,ZESTRIL) 20 MG tablet Take 20 mg by mouth daily.  . prazosin (MINIPRESS) 5 MG capsule Take 5 mg by mouth at bedtime.  . simvastatin (ZOCOR) 80 MG tablet Take 80 mg by mouth daily.  . tamsulosin (FLOMAX) 0.4 MG CAPS capsule Take 0.4 mg by mouth. Take 1 tab po  daily  . traZODone (DESYREL) 100 MG tablet Take 100 mg by mouth at bedtime.  . [DISCONTINUED] baclofen (LIORESAL) 10 MG tablet Take 10 mg by mouth daily as needed for muscle spasms.  . [DISCONTINUED] FLUoxetine (PROZAC) 20 MG tablet Take 20 mg by mouth daily.   No facility-administered encounter medications on file as of 02/18/2019.     Surgical History: Past Surgical History:  Procedure Laterality Date  . implant in mouth      Medical History: Past Medical History:  Diagnosis Date  . Allergy   . Sleep apnea     Family History: Family History  Problem Relation Age of Onset  . Colon cancer Sister   . Heart attack Mother   . Dementia Mother     Social History: Social History   Socioeconomic History  . Marital status: Married    Spouse name: Not on file  . Number of children: Not on file  . Years of education: Not on file  . Highest education level: Not on file  Occupational History  . Not on file  Social Needs  . Financial resource strain: Not on file  . Food insecurity    Worry: Not on file    Inability: Not on file  . Transportation needs    Medical: Not on file    Non-medical: Not on file  Tobacco Use  . Smoking status: Never Smoker  . Smokeless tobacco: Never Used  Substance and Sexual Activity  . Alcohol use: No    Frequency: Never  . Drug use: No  .  Sexual activity: Not on file  Lifestyle  . Physical activity    Days per week: Not on file    Minutes per session: Not on file  . Stress: Not on file  Relationships  . Social Herbalist on phone: Not on file    Gets together: Not on file    Attends religious service: Not on file    Active member of club or organization: Not on file    Attends meetings of clubs or organizations: Not on file    Relationship status: Not on file  . Intimate partner violence    Fear of current or ex partner: Not on file    Emotionally abused: Not on file    Physically abused: Not on file    Forced sexual  activity: Not on file  Other Topics Concern  . Not on file  Social History Narrative  . Not on file    Vital Signs: Blood pressure 140/80, pulse 85, temperature 97.7 F (36.5 C), resp. rate 16, height 5\' 11"  (1.803 m), weight 215 lb (97.5 kg), SpO2 99 %.  Examination: General Appearance: The patient is well-developed, well-nourished, and in no distress. Skin: Gross inspection of skin unremarkable. Head: normocephalic, no gross deformities. Eyes: no gross deformities noted. ENT: ears appear grossly normal no exudates. Neck: Supple. No thyromegaly. No LAD. Respiratory: clear bilaterally. Cardiovascular: Normal S1 and S2 without murmur or rub. Extremities: No cyanosis. pulses are equal. Neurologic: Alert and oriented. No involuntary movements.  LABS: No results found for this or any previous visit (from the past 2160 hour(s)).  Radiology: Patient was never admitted.  No results found.  No results found.    Assessment and Plan: Patient Active Problem List   Diagnosis Date Noted  . Encounter for general adult medical examination with abnormal findings 10/15/2017  . Essential hypertension, benign 10/15/2017  . Post traumatic stress disorder 10/15/2017  . Dysuria 10/15/2017  . Obstructive sleep apnea 04/26/2017    1. OSA on CPAP Excellent relief of symptoms.  Discussed good cpap compliance with patient, and its importance.  He verbalized understanding.  Pt will continue to use cpap.    2. Essential hypertension, benign Stable, continue present management.   3. Post traumatic stress disorder [facility].  Well controlled currently.   General Counseling: I have discussed the findings of the evaluation and examination with Awanda Mink.  I have also discussed any further diagnostic evaluation thatmay be needed or ordered today. Sheikh verbalizes understanding of the findings of todays visit. We also reviewed his medications today and discussed drug interactions  and side effects including but not limited excessive drowsiness and altered mental states. We also discussed that there is always a risk not just to him but also people around him. he has been encouraged to call the office with any questions or concerns that should arise related to todays visit.  No orders of the defined types were placed in this encounter.    Time spent: 25  I have personally obtained a history, examined the patient, evaluated laboratory and imaging results, formulated the assessment and plan and placed orders.    Allyne Gee, MD [facility] Pulmonary and Critical Care Sleep medicine

## 2019-02-19 ENCOUNTER — Telehealth: Payer: Self-pay

## 2019-02-19 NOTE — Telephone Encounter (Signed)
Faxed cpap RX order supply to [facility]. Bradley Gallagher

## 2019-07-31 ENCOUNTER — Other Ambulatory Visit: Payer: Self-pay

## 2019-07-31 ENCOUNTER — Ambulatory Visit (INDEPENDENT_AMBULATORY_CARE_PROVIDER_SITE_OTHER): Payer: Medicare Other

## 2019-07-31 DIAGNOSIS — G4733 Obstructive sleep apnea (adult) (pediatric): Secondary | ICD-10-CM | POA: Diagnosis not present

## 2019-07-31 NOTE — Progress Notes (Signed)
95 percentile pressure 7   95th percentile leak 2.7   apnea index 0.3 /hr  apnea-hypopnea index  0.7 /hr   total days used  >4 hr 90 days  total days used <4 hr 0 days  Total compliance 100 percent  He is doing great on cpap no problems or questions at this time. He does want to see Dr Welton Flakes for pulmonary to discuss the [facility] visits

## 2019-08-13 ENCOUNTER — Other Ambulatory Visit: Payer: Self-pay | Admitting: Internal Medicine

## 2019-08-13 DIAGNOSIS — R0602 Shortness of breath: Secondary | ICD-10-CM

## 2019-08-19 ENCOUNTER — Ambulatory Visit: Payer: Medicare Other | Admitting: Internal Medicine

## 2019-08-21 ENCOUNTER — Ambulatory Visit (INDEPENDENT_AMBULATORY_CARE_PROVIDER_SITE_OTHER): Payer: Medicare Other | Admitting: Internal Medicine

## 2019-08-21 ENCOUNTER — Other Ambulatory Visit: Payer: Self-pay

## 2019-08-21 DIAGNOSIS — R0602 Shortness of breath: Secondary | ICD-10-CM | POA: Diagnosis not present

## 2019-08-21 LAB — PULMONARY FUNCTION TEST

## 2019-09-01 NOTE — Procedures (Signed)
[facility] [address] [place], [address]  DATE OF SERVICE: Aug 21, 2019  Complete Pulmonary Function Testing Interpretation:  FINDINGS:  Forced vital capacity is normal FEV1 is 2.39 L which is 79% of predicted and is mildly decreased.  FEV1 FVC ratio is mildly decreased.  Postbronchodilator there is no significant improvement in the FEV1 clinical improvement may occur in the absence of spirometric improvement.  Total lung capacity is mildly decreased residual volume mildly decreased residual internal capacity ratio is FRC is moderately decreased.  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  There is no response to bronchodilators clinical response may still occur in the absence of spirometric response.  Yevonne Pax, MD [facility] Pulmonary Critical Care Medicine Sleep Medicine

## 2019-09-02 ENCOUNTER — Other Ambulatory Visit: Payer: Self-pay

## 2019-09-02 ENCOUNTER — Ambulatory Visit (INDEPENDENT_AMBULATORY_CARE_PROVIDER_SITE_OTHER): Payer: Medicare Other | Admitting: Internal Medicine

## 2019-09-02 ENCOUNTER — Encounter: Payer: Self-pay | Admitting: Internal Medicine

## 2019-09-02 VITALS — BP 123/74 | HR 74 | Temp 97.6°F | Resp 16 | Ht 71.0 in | Wt 215.2 lb

## 2019-09-02 DIAGNOSIS — R058 Other specified cough: Secondary | ICD-10-CM

## 2019-09-02 DIAGNOSIS — G4733 Obstructive sleep apnea (adult) (pediatric): Secondary | ICD-10-CM

## 2019-09-02 DIAGNOSIS — R05 Cough: Secondary | ICD-10-CM | POA: Diagnosis not present

## 2019-09-02 DIAGNOSIS — I10 Essential (primary) hypertension: Secondary | ICD-10-CM

## 2019-09-02 DIAGNOSIS — Z9989 Dependence on other enabling machines and devices: Secondary | ICD-10-CM

## 2019-09-02 DIAGNOSIS — J42 Unspecified chronic bronchitis: Secondary | ICD-10-CM | POA: Diagnosis not present

## 2019-09-02 NOTE — Progress Notes (Signed)
[facility] [place], [place] Gall[place] [address]  Pulmonary Sleep Medicine   Office Visit Note  Patient Name: Bradley Gallagher DOB: February 21, 1956 MRN [id number]  Date of Service: 09/02/2019  Complaints/HPI: Pt is here for follow up.  He reports nagging mild cough for over a few months.  He initially thought it was his allergies.  He does take lisinopril daily.  He reports the [place] diagnosed him with COPD.  He had a PFt that shows Mild restrictive disease. Pt reports good compliance with CPAP therapy. Cleaning machine by hand, and changing filters and tubing as directed. Denies headaches, sinus issues, palpitations, or hemoptysis.      ROS  General: (-) fever, (-) chills, (-) night sweats, (-) weakness Skin: (-) rashes, (-) itching,. Eyes: (-) visual changes, (-) redness, (-) itching. Nose and Sinuses: (-) nasal stuffiness or itchiness, (-) postnasal drip, (-) nosebleeds, (-) sinus trouble. Mouth and Throat: (-) sore throat, (-) hoarseness. Neck: (-) swollen glands, (-) enlarged thyroid, (-) neck pain. Respiratory: - cough, (-) bloody sputum, - shortness of breath, - wheezing. Cardiovascular: - ankle swelling, (-) chest pain. Lymphatic: (-) lymph node enlargement. Neurologic: (-) numbness, (-) tingling. Psychiatric: (-) anxiety, (-) depression   Current Medication: Outpatient Encounter Medications as of 09/02/2019  Medication Sig   ARIPiprazole (ABILIFY) 10 MG tablet Take 10 mg by mouth daily.   aspirin (ASPIRIN LOW DOSE) 81 MG tablet Take 81 mg by mouth daily.   cholecalciferol (VITAMIN D) 1000 units tablet Take 1,000 Units by mouth daily.   Dexlansoprazole (DEXILANT) 30 MG capsule Take 30 mg by mouth daily.   escitalopram (LEXAPRO) 20 MG tablet Take 20 mg by mouth daily.   lisinopril (PRINIVIL,ZESTRIL) 20 MG tablet Take 20 mg by mouth daily.   prazosin (MINIPRESS) 5 MG capsule Take 5 mg by mouth at bedtime.   simvastatin (ZOCOR) 80 MG tablet Take 80 mg by mouth  daily.   tamsulosin (FLOMAX) 0.4 MG CAPS capsule Take 0.4 mg by mouth. Take 1 tab po daily   traZODone (DESYREL) 100 MG tablet Take 100 mg by mouth at bedtime.   No facility-administered encounter medications on file as of 09/02/2019.    Surgical History: Past Surgical History:  Procedure Laterality Date   implant in mouth      Medical History: Past Medical History:  Diagnosis Date   Allergy    Sleep apnea     Family History: Family History  Problem Relation Age of Onset   Colon cancer Sister    Heart attack Mother    Dementia Mother     Social History: Social History   Socioeconomic History   Marital status: Married    Spouse name: Not on file   Number of children: Not on file   Years of education: Not on file   Highest education level: Not on file  Occupational History   Not on file  Tobacco Use   Smoking status: Never Smoker   Smokeless tobacco: Never Used  Substance and Sexual Activity   Alcohol use: No   Drug use: No   Sexual activity: Not on file  Other Topics Concern   Not on file  Social History Narrative   Not on file   Social Determinants of Health   Financial Resource Strain:    Difficulty of Paying Living Expenses:   Food Insecurity:    Worried About Bradley Gallagher in the Last Year:    Bradley Gallagher in the Last Year:  Transportation Needs:    Freight forwarder (Medical):    Lack of Transportation (Non-Medical):   Physical Activity:    Days of Exercise per Week:    Minutes of Exercise per Session:   Stress:    Feeling of Stress :   Social Connections:    Frequency of Communication with Friends and Family:    Frequency of Social Gatherings with Friends and Family:    Attends Religious Services:    Active Member of Clubs or Organizations:    Attends Engineer, structural:    Marital Status:   Intimate Partner Violence:    Fear of Current or Ex-Partner:    Emotionally Abused:     Physically Abused:    Sexually Abused:     Vital Signs: Blood pressure 123/74, pulse 74, temperature 97.6 F (36.4 C), resp. rate 16, height 5\' 11"  (1.803 m), weight 215 lb 3.2 oz (97.6 kg), SpO2 97 %.  Examination: General Appearance: The patient is well-developed, well-nourished, and in no distress. Skin: Gross inspection of skin unremarkable. Head: normocephalic, no gross deformities. Eyes: no gross deformities noted. ENT: ears appear grossly normal no exudates. Neck: Supple. No thyromegaly. No LAD. Respiratory: clear bilaterally. Cardiovascular: Normal S1 and S2 without murmur or rub. Extremities: No cyanosis. pulses are equal. Neurologic: Alert and oriented. No involuntary movements.  LABS: No results found for this or any previous visit (from the past 2160 hour(s)).  Radiology: Patient was never admitted.  No results found.  No results found.    Assessment and Plan: Patient Active Problem List   Diagnosis Date Noted   Encounter for general adult medical examination with abnormal findings 10/15/2017   Essential hypertension, benign 10/15/2017   Post traumatic stress disorder 10/15/2017   Dysuria 10/15/2017   Obstructive sleep apnea 04/26/2017   1. OSA on CPAP Stable continue to use cpap as directed.   2. Essential hypertension, benign Stable, discussed changing lisinopril for cough.  [facility]   3. Chronic bronchitis, unspecified chronic bronchitis type (HCC) Stable, continue to monitor.   4. Cough due to ACE inhibitor Advised patient to talk with PCP at [facility] to change ace inhibitor for trail to see if cough improves.    General Counseling: I have discussed the findings of the evaluation and examination with [facility].  I have also discussed any further diagnostic evaluation thatmay be needed or ordered today. Bradley Gallagher verbalizes understanding of the findings of todays visit. We also reviewed his medications today and discussed drug interactions  and side effects including but not limited excessive drowsiness and altered mental states. We also discussed that there is always a risk not just to him but also people around him. he has been encouraged to call the office with any questions or concerns that should arise related to todays visit.  No orders of the defined types were placed in this encounter.    Time spent: 30 This patient was seen by Kevin Fenton AGNP-C in Collaboration with Dr. Blima Ledger as a part of collaborative care agreement.   I have personally obtained a history, examined the patient, evaluated laboratory and imaging results, formulated the assessment and plan and placed orders.    Freda Munro, MD [facility] Pulmonary and Critical Care Sleep medicine

## 2019-10-03 ENCOUNTER — Telehealth: Payer: Self-pay

## 2019-10-03 NOTE — Telephone Encounter (Signed)
Confirmed appointment on 10/07/2019 and screened for covid. [facility] 

## 2019-10-07 ENCOUNTER — Other Ambulatory Visit: Payer: Self-pay

## 2019-10-07 ENCOUNTER — Encounter: Payer: Self-pay | Admitting: Internal Medicine

## 2019-10-07 ENCOUNTER — Ambulatory Visit (INDEPENDENT_AMBULATORY_CARE_PROVIDER_SITE_OTHER): Payer: Medicare Other | Admitting: Internal Medicine

## 2019-10-07 VITALS — BP 142/78 | HR 79 | Resp 16 | Ht 71.0 in | Wt 214.2 lb

## 2019-10-07 DIAGNOSIS — R0602 Shortness of breath: Secondary | ICD-10-CM

## 2019-10-07 DIAGNOSIS — J849 Interstitial pulmonary disease, unspecified: Secondary | ICD-10-CM | POA: Diagnosis not present

## 2019-10-07 DIAGNOSIS — R0789 Other chest pain: Secondary | ICD-10-CM | POA: Diagnosis not present

## 2019-10-07 NOTE — Progress Notes (Signed)
[facility] [place], [place] [address]  Pulmonary Sleep Medicine   Office Visit Note  Patient Name: Bradley Gallagher DOB: 02-Jun-1955 MRN [id number]  Date of Service: 10/07/2019  Complaints/HPI: Chest tightness. Patient states that he has had ongoing problems with tightness in his chest. Patient underwent a cardiac cath done which shows clean coronaries. He had some valvular issues which he was told did not require any intervention. Patient states he was also diagnosed with COPD at another facility. Patient states that he has had soreness in his chest for some time. Comes and goes no real pain as such. Patient states occasional sputum is noted. No hemoptysis. He had RLD noted on the PFT. Being African American could be sarcoid. He has not had imaging done for the evaluation for lung parenchyma.  ROS  General: (-) fever, (-) chills, (-) night sweats, (-) weakness Skin: (-) rashes, (-) itching,. Eyes: (-) visual changes, (-) redness, (-) itching. Nose and Sinuses: (-) nasal stuffiness or itchiness, (-) postnasal drip, (-) nosebleeds, (-) sinus trouble. Mouth and Throat: (-) sore throat, (-) hoarseness. Neck: (-) swollen glands, (-) enlarged thyroid, (-) neck pain. Respiratory: - cough, (-) bloody sputum, + shortness of breath, - wheezing. Cardiovascular: - ankle swelling, (-) chest pain. Lymphatic: (-) lymph node enlargement. Neurologic: (-) numbness, (-) tingling. Psychiatric: (-) anxiety, (-) depression   Current Medication: Outpatient Encounter Medications as of 10/07/2019  Medication Sig  . allopurinol (ZYLOPRIM) 100 MG tablet Take 100 mg by mouth daily.  . ARIPiprazole (ABILIFY) 10 MG tablet Take 10 mg by mouth daily.  Marland Kitchen aspirin (ASPIRIN LOW DOSE) 81 MG tablet Take 81 mg by mouth daily.  . cholecalciferol (VITAMIN D) 1000 units tablet Take 1,000 Units by mouth daily.  Marland Kitchen Dexlansoprazole (DEXILANT) 30 MG capsule Take 30 mg by mouth daily.  Marland Kitchen escitalopram  (LEXAPRO) 20 MG tablet Take 20 mg by mouth daily.  Marland Kitchen lisinopril (PRINIVIL,ZESTRIL) 20 MG tablet Take 20 mg by mouth daily.  . prazosin (MINIPRESS) 5 MG capsule Take 5 mg by mouth at bedtime.  . simvastatin (ZOCOR) 80 MG tablet Take 80 mg by mouth daily.  . tamsulosin (FLOMAX) 0.4 MG CAPS capsule Take 0.4 mg by mouth. Take 1 tab po daily  . traZODone (DESYREL) 100 MG tablet Take 100 mg by mouth at bedtime.   No facility-administered encounter medications on file as of 10/07/2019.    Surgical History: Past Surgical History:  Procedure Laterality Date  . implant in mouth      Medical History: Past Medical History:  Diagnosis Date  . Allergy   . Sleep apnea     Family History: Family History  Problem Relation Age of Onset  . Colon cancer Sister   . Heart attack Mother   . Dementia Mother     Social History: Social History   Socioeconomic History  . Marital status: Married    Spouse name: Not on file  . Number of children: Not on file  . Years of education: Not on file  . Highest education level: Not on file  Occupational History  . Not on file  Tobacco Use  . Smoking status: Never Smoker  . Smokeless tobacco: Never Used  Vaping Use  . Vaping Use: Never used  Substance and Sexual Activity  . Alcohol use: No  . Drug use: No  . Sexual activity: Not on file  Other Topics Concern  . Not on file  Social History Narrative  . Not on file  Social Determinants of Health   Financial Resource Strain:   . Difficulty of Paying Living Expenses:   Food Insecurity:   . Worried About Charity fundraiser in the Last Year:   . Arboriculturist in the Last Year:   Transportation Needs:   . Film/video editor (Medical):   Marland Kitchen Lack of Transportation (Non-Medical):   Physical Activity:   . Days of Exercise per Week:   . Minutes of Exercise per Session:   Stress:   . Feeling of Stress :   Social Connections:   . Frequency of Communication with Friends and Family:   .  Frequency of Social Gatherings with Friends and Family:   . Attends Religious Services:   . Active Member of Clubs or Organizations:   . Attends Archivist Meetings:   Marland Kitchen Marital Status:   Intimate Partner Violence:   . Fear of Current or Ex-Partner:   . Emotionally Abused:   Marland Kitchen Physically Abused:   . Sexually Abused:     Vital Signs: Blood pressure (!) 142/78, pulse 79, resp. rate 16, height _0  (1.803 m), weight 214 lb 3.2 oz (97.2 kg), SpO2 98 %.  Examination: General Appearance: The patient is well-developed, well-nourished, and in no distress. Skin: Gross inspection of skin unremarkable. Head: normocephalic, no gross deformities. Eyes: no gross deformities noted. ENT: ears appear grossly normal no exudates. Neck: Supple. No thyromegaly. No LAD. Respiratory: no rhonchi noted at this time. Cardiovascular: Normal S1 and S2 without murmur or rub. Extremities: No cyanosis. pulses are equal. Neurologic: Alert and oriented. No involuntary movements.  LABS: Recent Results (from the past 2160 hour(s))  Pulmonary Function Test     Status: None   Collection Time: 08/21/19  9:00 AM  Result Value Ref Range   FEV1     FVC     FEV1/FVC     TLC     DLCO      Radiology: Patient was never admitted.  No results found.  No results found.    Assessment and Plan: Patient Active Problem List   Diagnosis Date Noted  . Encounter for general adult medical examination with abnormal findings 10/15/2017  . Essential hypertension, benign 10/15/2017  . Post traumatic stress disorder 10/15/2017  . Dysuria 10/15/2017  . Obstructive sleep apnea 04/26/2017    1. RLD/ILD my concern in this case is whether he might have some underlying interstitial lung disease.  He did have a restrictive pattern on his pulmonary functions he is not particularly obese to explain this.  Of course in a African-American of concern would be interstitial lung disease related to underlying sarcoidosis.   I have suggested that we get a ACE level as well as a ANA and ANCA and sed rate and rheumatoid factor.  This should complete some of the work-up for interstitial lung disease.  In addition to this I have requested that we get a CT scan of the chest with and without contrast and also this will help [place] to look for any underlying pulmonary vascular disease as well as interstitial lung disease. 2. OSA he says he is doing excellent with this he continues to have good compliance rates we will continue to encourage the therapy with positive airway pressure 3. Chest Tightness negative for cardiac work-up according to the patient.  Need to complete his work-up related to chest tightness with a pulmonary work-up CT scan of the chest will be ordered as already mentioned above and also with  did order blood work for checking for atypical interstitial disease 4. PTSD at baseline continues to go to the [place] for management  General Counseling: I have discussed the findings of the evaluation and examination with Bradley Gallagher.  I have also discussed any further diagnostic evaluation thatmay be needed or ordered today. Manus verbalizes understanding of the findings of todays visit. We also reviewed his medications today and discussed drug interactions and side effects including but not limited excessive drowsiness and altered mental states. We also discussed that there is always a risk not just to him but also people around him. he has been encouraged to call the office with any questions or concerns that should arise related to todays visit.  Orders Placed This Encounter  Procedures  . CT Angio Chest W/Cm &/Or Wo Cm    Standing Status:   Future    Standing Expiration Date:   10/06/2020    Order Specific Question:   If indicated for the ordered procedure, I authorize the administration of contrast media per Radiology protocol    Answer:   Yes    Order Specific Question:   Preferred imaging location?    Answer:   [facility]    Order Specific Question:   Radiology Contrast Protocol - do NOT remove file path    Answer:   [email or web address]  . Angiotensin converting enzyme  . Sed Rate (ESR)  . Rheumatoid Factor  . ANA Direct w/Reflex if Positive  . ANCA Screen Reflex Titer(QUEST)     Time spent: 64mn  I have personally obtained a history, examined the patient, evaluated laboratory and imaging results, formulated the assessment and plan and placed orders.    SAllyne Gee MD FDel Amo HospitalPulmonary and [facility] Sleep medicine

## 2019-10-07 NOTE — Patient Instructions (Signed)
CT Angiogram  A CT angiogram is a procedure to look at the blood vessels in various areas of the body. For this procedure, a large X-ray machine, called a CT scanner, takes detailed pictures of blood vessels that have been injected with a dye (contrast material). A CT angiogram allows your health care provider to see how well blood is flowing to the area of your body that is being checked. Your health care provider will be able to see if there are any problems, such as a blockage. Tell a health care provider about:  Any allergies you have.  All medicines you are taking, including vitamins, herbs, eye drops, creams, and over-the-counter medicines.  Any problems you or family members have had with anesthetic medicines.  Any blood disorders you have.  Any surgeries you have had.  Any medical conditions you have.  Whether you are pregnant or may be pregnant.  Whether you are breastfeeding.  Any anxiety disorders, chronic pain, or other conditions you have that may increase your stress or prevent you from lying still. What are the risks? Generally, this is a safe procedure. However, problems may occur, including:  Infection.  Bleeding.  Allergic reactions to medicines or dyes.  Damage to other structures or organs.  Kidney damage from the dye or contrast that is used.  Increased risk of cancer from radiation exposure. This risk is low. Talk with your health care provider about: ? The risks and benefits of testing. ? How you can receive the lowest dose of radiation. What happens before the procedure?  Wear comfortable clothing and remove any jewelry.  Follow instructions from your health care provider about eating and drinking. For most people, instructions may include these actions: ? For 12 hours before the test, avoid caffeine. This includes tea, coffee, soda, and energy drinks or pills. ? For 3-4 hours before the test, stop eating or drinking anything but water. ? Stay  well hydrated by continuing to drink water before the exam. This will help to clear the contrast dye from your body after the test.  Ask your health care provider about changing or stopping your regular medicines. This is especially important if you are taking diabetes medicines or blood thinners. What happens during the procedure?  An IV tube will be inserted into one of your veins.  You will be asked to lie on an exam table. This table will slide in and out of the CT machine during the procedure.  Contrast dye will be injected into the IV tube. You might feel warm, or you may get a metallic taste in your mouth.  The table that you are lying on will move into the CT machine tunnel for the scan.  The person running the machine will give you instructions while the scans are being done. You may be asked to: ? Keep your arms above your head. ? Hold your breath. ? Stay very still, even if the table is moving.  When the scanning is complete, you will be moved out of the machine.  The IV tube will be removed. The procedure may vary among health care providers and hospitals. What happens after the procedure?  You might feel warm, or you may get a metallic taste in your mouth.  You may be asked to drink water or other fluids to wash (flush) the contrast material out of your body.  It is up to you to get the results of your procedure. Ask your health care provider, or the department   that is doing the procedure, when your results will be ready. Summary  A CT angiogram is a procedure to look at the blood vessels in various areas of the body.  You will need to stay very still during the exam.  You may be asked to drink water or other fluids to wash (flush) the contrast material out of your body after your scan. This information is not intended to replace advice given to you by your health care provider. Make sure you discuss any questions you have with your health care provider. Document  Revised: 05/24/2018 Document Reviewed: 11/12/2015 Elsevier Patient Education  2020 Elsevier Inc.  

## 2019-10-21 ENCOUNTER — Ambulatory Visit
Admission: RE | Admit: 2019-10-21 | Discharge: 2019-10-21 | Disposition: A | Discharge status: Home / Self Care | Payer: Medicare Other | Source: Ambulatory Visit | Attending: Internal Medicine | Admitting: Internal Medicine

## 2019-10-21 ENCOUNTER — Other Ambulatory Visit: Payer: Self-pay

## 2019-10-21 DIAGNOSIS — J849 Interstitial pulmonary disease, unspecified: Secondary | ICD-10-CM | POA: Diagnosis not present

## 2019-10-21 DIAGNOSIS — J449 Chronic obstructive pulmonary disease, unspecified: Secondary | ICD-10-CM | POA: Diagnosis not present

## 2019-10-25 ENCOUNTER — Telehealth: Payer: Self-pay

## 2019-10-25 NOTE — Telephone Encounter (Signed)
Left VM informing pt that his xray was normal.  Advised to call back if any questions.  dbs

## 2019-11-07 ENCOUNTER — Ambulatory Visit: Payer: Medicare Other | Admitting: Internal Medicine

## 2019-11-14 ENCOUNTER — Ambulatory Visit (INDEPENDENT_AMBULATORY_CARE_PROVIDER_SITE_OTHER): Payer: Medicare Other | Admitting: Internal Medicine

## 2019-11-14 ENCOUNTER — Encounter: Payer: Self-pay | Admitting: Internal Medicine

## 2019-11-14 ENCOUNTER — Other Ambulatory Visit: Payer: Self-pay

## 2019-11-14 VITALS — BP 137/74 | HR 67 | Temp 97.8°F | Resp 16 | Ht 71.0 in | Wt 208.0 lb

## 2019-11-14 DIAGNOSIS — R0789 Other chest pain: Secondary | ICD-10-CM | POA: Diagnosis not present

## 2019-11-14 DIAGNOSIS — Z9989 Dependence on other enabling machines and devices: Secondary | ICD-10-CM | POA: Diagnosis not present

## 2019-11-14 DIAGNOSIS — J4520 Mild intermittent asthma, uncomplicated: Secondary | ICD-10-CM

## 2019-11-14 DIAGNOSIS — G4733 Obstructive sleep apnea (adult) (pediatric): Secondary | ICD-10-CM

## 2019-11-14 DIAGNOSIS — I10 Essential (primary) hypertension: Secondary | ICD-10-CM

## 2019-11-14 NOTE — Patient Instructions (Signed)
Shortness of Breath, Adult Shortness of breath means you have trouble breathing. Shortness of breath could be a sign of a medical problem. Follow these instructions at home:   Watch for any changes in your symptoms.  Do not use any products that contain nicotine or tobacco, such as cigarettes, e-cigarettes, and chewing tobacco.  Do not smoke. Smoking can cause shortness of breath. If you need help to quit smoking, ask your doctor.  Avoid things that can make it harder to breathe, such as: ? Mold. ? Dust. ? Air pollution. ? Chemical smells. ? Things that can cause allergy symptoms (allergens), if you have allergies.  Keep your living space clean. Use products that help remove mold and dust.  Rest as needed. Slowly return to your normal activities.  Take over-the-counter and prescription medicines only as told by your doctor. This includes oxygen therapy and inhaled medicines.  Keep all follow-up visits as told by your doctor. This is important. Contact a doctor if:  Your condition does not get better as soon as expected.  You have a hard time doing your normal activities, even after you rest.  You have new symptoms. Get help right away if:  Your shortness of breath gets worse.  You have trouble breathing when you are resting.  You feel light-headed or you pass out (faint).  You have a cough that is not helped by medicines.  You cough up blood.  You have pain with breathing.  You have pain in your chest, arms, shoulders, or belly (abdomen).  You have a fever.  You cannot walk up stairs.  You cannot exercise the way you normally do. These symptoms may represent a serious problem that is an emergency. Do not wait to see if the symptoms will go away. Get medical help right away. Call your local emergency services (911 in the [place] Do not drive yourself to the hospital. Summary  Shortness of breath is when you have trouble breathing enough air. It can be a sign of a  medical problem.  Avoid things that make it hard for you to breathe, such as smoking, pollution, mold, and dust.  Watch for any changes in your symptoms. Contact your doctor if you do not get better or you get worse. This information is not intended to replace advice given to you by your health care provider. Make sure you discuss any questions you have with your health care provider. Document Revised: 08/14/2017 Document Reviewed: 08/14/2017 Elsevier Patient Education  2020 Elsevier Inc.  

## 2019-11-14 NOTE — Progress Notes (Signed)
[facility] [address] [place], [place] [address]  Pulmonary Sleep Medicine   Office Visit Note  Patient Name: Bradley Gallagher DOB: 1955-05-21 MRN [id number]  Date of Service: 11/14/2019  Complaints/HPI: Patient is here for follow-up after CT scan.  I was able to inform him that the CT scan of his chest did not show any evidence of interstitial lung disease.  Did not appear to have any enlarged lymph nodes also to suggest sarcoidosis.  Believe that the chest pain is unlikely related to any pulmonary issues however he did have mild reduction in his FEV1 and certainly could be an asthmatic variant that could be contributing to his symptoms.  I suggested to him that he continue to monitor his symptoms but for now I do not believe any intervention is necessary.  He has had a rather thorough cardiopulmonary work-up done which has not revealed any significant pathology other than a very mild reduction in his FEV1.  As far as his blood pressure is concerned and the cough was concerned he states he is no longer taking the lisinopril and was given losartan.  I tried to find the medications on his list however could not he is going to call back to my staff to clarify his medications.  ROS  General: (-) fever, (-) chills, (-) night sweats, (-) weakness Skin: (-) rashes, (-) itching,. Eyes: (-) visual changes, (-) redness, (-) itching. Nose and Sinuses: (-) nasal stuffiness or itchiness, (-) postnasal drip, (-) nosebleeds, (-) sinus trouble. Mouth and Throat: (-) sore throat, (-) hoarseness. Neck: (-) swollen glands, (-) enlarged thyroid, (-) neck pain. Respiratory: - cough, (-) bloody sputum, - shortness of breath, - wheezing. Cardiovascular: - ankle swelling, (-) chest pain. Lymphatic: (-) lymph node enlargement. Neurologic: (-) numbness, (-) tingling. Psychiatric: (-) anxiety, (-) depression   Current Medication: Outpatient Encounter Medications as of 11/14/2019  Medication Sig  .  allopurinol (ZYLOPRIM) 100 MG tablet Take 100 mg by mouth daily.  . ARIPiprazole (ABILIFY) 10 MG tablet Take 10 mg by mouth daily.  Marland Kitchen aspirin (ASPIRIN LOW DOSE) 81 MG tablet Take 81 mg by mouth daily.  . cholecalciferol (VITAMIN D) 1000 units tablet Take 1,000 Units by mouth daily.  Marland Kitchen Dexlansoprazole (DEXILANT) 30 MG capsule Take 30 mg by mouth daily.  Marland Kitchen escitalopram (LEXAPRO) 20 MG tablet Take 20 mg by mouth daily.  . simvastatin (ZOCOR) 80 MG tablet Take 80 mg by mouth daily.  . tamsulosin (FLOMAX) 0.4 MG CAPS capsule Take 0.4 mg by mouth. Take 1 tab po daily  . traZODone (DESYREL) 100 MG tablet Take 100 mg by mouth at bedtime.  . [DISCONTINUED] lisinopril (PRINIVIL,ZESTRIL) 20 MG tablet Take 20 mg by mouth daily.  . [DISCONTINUED] prazosin (MINIPRESS) 5 MG capsule Take 5 mg by mouth at bedtime.   No facility-administered encounter medications on file as of 11/14/2019.    Surgical History: Past Surgical History:  Procedure Laterality Date  . implant in mouth      Medical History: Past Medical History:  Diagnosis Date  . Allergy   . Sleep apnea     Family History: Family History  Problem Relation Age of Onset  . Colon cancer Sister   . Heart attack Mother   . Dementia Mother     Social History: Social History   Socioeconomic History  . Marital status: Married    Spouse name: Not on file  . Number of children: Not on file  . Years of education: Not on  file  . Highest education level: Not on file  Occupational History  . Not on file  Tobacco Use  . Smoking status: Never Smoker  . Smokeless tobacco: Never Used  Vaping Use  . Vaping Use: Never used  Substance and Sexual Activity  . Alcohol use: No  . Drug use: No  . Sexual activity: Not on file  Other Topics Concern  . Not on file  Social History Narrative  . Not on file   Social Determinants of Health   Financial Resource Strain:   . Difficulty of Paying Living Expenses: Not on file  Food Insecurity:   .  Worried About Programme researcher, broadcasting/film/video in the Last Year: Not on file  . Ran Out of Food in the Last Year: Not on file  Transportation Needs:   . Lack of Transportation (Medical): Not on file  . Lack of Transportation (Non-Medical): Not on file  Physical Activity:   . Days of Exercise per Week: Not on file  . Minutes of Exercise per Session: Not on file  Stress:   . Feeling of Stress : Not on file  Social Connections:   . Frequency of Communication with Friends and Family: Not on file  . Frequency of Social Gatherings with Friends and Family: Not on file  . Attends Religious Services: Not on file  . Active Member of Clubs or Organizations: Not on file  . Attends Banker Meetings: Not on file  . Marital Status: Not on file  Intimate Partner Violence:   . Fear of Current or Ex-Partner: Not on file  . Emotionally Abused: Not on file  . Physically Abused: Not on file  . Sexually Abused: Not on file    Vital Signs: Blood pressure 137/74, pulse 67, temperature 97.8 F (36.6 C), resp. rate 16, height 5\' 11"  (1.803 m), weight 208 lb (94.3 kg), SpO2 98 %.  Examination: General Appearance: The patient is well-developed, well-nourished, and in no distress. Skin: Gross inspection of skin unremarkable. Head: normocephalic, no gross deformities. Eyes: no gross deformities noted. ENT: ears appear grossly normal no exudates. Neck: Supple. No thyromegaly. No LAD. Respiratory: no rhonchi noted at this time. Cardiovascular: Normal S1 and S2 without murmur or rub. Extremities: No cyanosis. pulses are equal. Neurologic: Alert and oriented. No involuntary movements.  LABS: Recent Results (from the past 2160 hour(s))  Pulmonary Function Test     Status: None   Collection Time: 08/21/19  9:00 AM  Result Value Ref Range   FEV1     FVC     FEV1/FVC     TLC     DLCO      Radiology: CT Chest High Resolution  Result Date: 10/21/2019 CLINICAL DATA:  Intermittent chest pains for 3-5  years.  COPD. EXAM: CT CHEST WITHOUT CONTRAST TECHNIQUE: Multidetector CT imaging of the chest was performed following the standard protocol without intravenous contrast. High resolution imaging of the lungs, as well as inspiratory and expiratory imaging, was performed. COMPARISON:  None. FINDINGS: Cardiovascular: Heart is at the upper limits of normal in size to mildly enlarged. No pericardial effusion. Mediastinum/Nodes: Subcentimeter low-attenuation lesion in the right lobe of the thyroid. No follow-up recommended (ref: J Am Coll Radiol. 2015 Feb;12(2): 143-50).No pathologically enlarged mediastinal or axillary lymph nodes. Hilar regions are difficult to definitively evaluate without IV contrast. Esophagus is grossly unremarkable. Lungs/Pleura: Mild bibasilar scarring. Negative for subpleural reticulation, traction bronchiectasis/bronchiolectasis, ground-glass, architectural distortion or honeycombing. Image quality is somewhat degraded by respiratory  motion. No pleural fluid. Airway is unremarkable. Expiratory phase imaging was not performed in true expiration, limiting the evaluation for air trapping. Upper Abdomen: There may be a vague 1.9 cm low-attenuation lesion in the posteromedial right hepatic lobe. Further characterization is limited without IV contrast. Visualized portions of the liver, gallbladder, adrenal glands, kidneys, spleen, pancreas, stomach and bowel are otherwise unremarkable. Musculoskeletal: Degenerative changes in the spine. No worrisome lytic or sclerotic lesions. IMPRESSION: No evidence of interstitial lung disease. Electronically Signed   By: Leanna BattlesMelinda  Blietz M.D.   On: 10/21/2019 14:42    No results found.  CT Chest High Resolution  Result Date: 10/21/2019 CLINICAL DATA:  Intermittent chest pains for 3-5 years.  COPD. EXAM: CT CHEST WITHOUT CONTRAST TECHNIQUE: Multidetector CT imaging of the chest was performed following the standard protocol without intravenous contrast. High  resolution imaging of the lungs, as well as inspiratory and expiratory imaging, was performed. COMPARISON:  None. FINDINGS: Cardiovascular: Heart is at the upper limits of normal in size to mildly enlarged. No pericardial effusion. Mediastinum/Nodes: Subcentimeter low-attenuation lesion in the right lobe of the thyroid. No follow-up recommended (ref: J Am Coll Radiol. 2015 Feb;12(2): 143-50).No pathologically enlarged mediastinal or axillary lymph nodes. Hilar regions are difficult to definitively evaluate without IV contrast. Esophagus is grossly unremarkable. Lungs/Pleura: Mild bibasilar scarring. Negative for subpleural reticulation, traction bronchiectasis/bronchiolectasis, ground-glass, architectural distortion or honeycombing. Image quality is somewhat degraded by respiratory motion. No pleural fluid. Airway is unremarkable. Expiratory phase imaging was not performed in true expiration, limiting the evaluation for air trapping. Upper Abdomen: There may be a vague 1.9 cm low-attenuation lesion in the posteromedial right hepatic lobe. Further characterization is limited without IV contrast. Visualized portions of the liver, gallbladder, adrenal glands, kidneys, spleen, pancreas, stomach and bowel are otherwise unremarkable. Musculoskeletal: Degenerative changes in the spine. No worrisome lytic or sclerotic lesions. IMPRESSION: No evidence of interstitial lung disease. Electronically Signed   By: Leanna BattlesMelinda  Blietz M.D.   On: 10/21/2019 14:42      Assessment and Plan: Patient Active Problem List   Diagnosis Date Noted  . Encounter for general adult medical examination with abnormal findings 10/15/2017  . Essential hypertension, benign 10/15/2017  . Post traumatic stress disorder 10/15/2017  . Dysuria 10/15/2017  . Obstructive sleep apnea 04/26/2017    1. OSA he is to continue with Pap therapy he states that he is doing well with it.  Plan is going to continue to follow along 2. CP still has twinges  of chest pain as he describes it.  Appears to be rather atypical unlikely cardiac or pulmonary in origin. 3. HTN appears to be controlled he needs to continue with his losartan that he was given previously by he states he gave the medication to him however I cannot find a prescription will have rest of the staff look through them check 4. ?Asthma this is a possibility and I am not entirely ruling out this as a cause of his chest tightness that he describes he is going to continue to follow his symptoms.  He does not have any significant wheezing etc.  General Counseling: I have discussed the findings of the evaluation and examination with [name].  I have also discussed any further diagnostic evaluation thatmay be needed or ordered today. [name] verbalizes understanding of the findings of todays visit. We also reviewed his medications today and discussed drug interactions and side effects including but not limited excessive drowsiness and altered mental states. We also discussed that there  is always a risk not just to him but also people around him. he has been encouraged to call the office with any questions or concerns that should arise related to todays visit.  No orders of the defined types were placed in this encounter.    Time spent: 30  I have personally obtained a history, examined the patient, evaluated laboratory and imaging results, formulated the assessment and plan and placed orders.    Yevonne Pax, MD [facility] Pulmonary and Critical Care Sleep medicine

## 2020-01-13 DIAGNOSIS — N400 Enlarged prostate without lower urinary tract symptoms: Secondary | ICD-10-CM | POA: Diagnosis not present

## 2020-01-13 DIAGNOSIS — I10 Essential (primary) hypertension: Secondary | ICD-10-CM | POA: Diagnosis not present

## 2020-01-13 DIAGNOSIS — Z20822 Contact with and (suspected) exposure to covid-19: Secondary | ICD-10-CM | POA: Diagnosis not present

## 2020-01-13 DIAGNOSIS — F319 Bipolar disorder, unspecified: Secondary | ICD-10-CM | POA: Diagnosis not present

## 2020-01-13 DIAGNOSIS — E785 Hyperlipidemia, unspecified: Secondary | ICD-10-CM | POA: Diagnosis not present

## 2020-01-29 ENCOUNTER — Ambulatory Visit (INDEPENDENT_AMBULATORY_CARE_PROVIDER_SITE_OTHER): Payer: Medicare Other

## 2020-01-29 ENCOUNTER — Other Ambulatory Visit: Payer: Self-pay

## 2020-01-29 DIAGNOSIS — G4733 Obstructive sleep apnea (adult) (pediatric): Secondary | ICD-10-CM

## 2020-01-29 NOTE — Progress Notes (Signed)
95 percentile pressure 7   95th percentile leak 7.3   apnea index 0.5 /hr  apnea-hypopnea index  0.8 /hr   total days used  >4 hr 30 days  total days used <4 hr 0 days  Total compliance 100 percent  He is doing great, no problems or questions at this time

## 2020-02-11 ENCOUNTER — Ambulatory Visit: Payer: Medicare Other | Admitting: Internal Medicine

## 2020-02-18 DIAGNOSIS — Z23 Encounter for immunization: Secondary | ICD-10-CM | POA: Diagnosis not present

## 2020-03-02 ENCOUNTER — Other Ambulatory Visit: Payer: Self-pay

## 2020-05-18 ENCOUNTER — Ambulatory Visit: Payer: Medicare Other | Admitting: Internal Medicine

## 2020-05-25 ENCOUNTER — Other Ambulatory Visit: Payer: Self-pay | Admitting: Hospice and Palliative Medicine

## 2020-05-26 ENCOUNTER — Telehealth: Payer: Self-pay

## 2020-05-26 NOTE — Telephone Encounter (Signed)
F[facility]ed [facility]ap [facility]. [employer] Phone [phone] Fax [phone]

## 2020-06-01 ENCOUNTER — Encounter: Payer: Self-pay | Admitting: Internal Medicine

## 2020-06-01 ENCOUNTER — Ambulatory Visit (INDEPENDENT_AMBULATORY_CARE_PROVIDER_SITE_OTHER): Payer: Medicare PPO | Admitting: Internal Medicine

## 2020-06-01 VITALS — BP 128/80 | HR 88 | Temp 97.8°F | Resp 16 | Ht 71.0 in | Wt 212.0 lb

## 2020-06-01 DIAGNOSIS — Z7189 Other specified counseling: Secondary | ICD-10-CM | POA: Diagnosis not present

## 2020-06-01 DIAGNOSIS — G4733 Obstructive sleep apnea (adult) (pediatric): Secondary | ICD-10-CM

## 2020-06-01 DIAGNOSIS — I10 Essential (primary) hypertension: Secondary | ICD-10-CM

## 2020-06-01 NOTE — Progress Notes (Signed)
[facility] [address] [place], [place] [address]  Pulmonary Sleep Medicine   Office Visit Note  Patient Name: Bradley Gallagher DOB: 1955/08/12 MRN [id number]  Date of Service: 06/01/2020  Complaints/HPI: He is doing well with his sleep. He is using his device good response. Patient states his compliamce was excellent at the last downolad. Patient has been working on getting his DM under control. He has not gained any weight. Continues to stay active and workout in the gym. Denies having CP noted. No fevers or chills  ROS  General: (-) fever, (-) chills, (-) night sweats, (-) weakness Skin: (-) rashes, (-) itching,. Eyes: (-) visual changes, (-) redness, (-) itching. Nose and Sinuses: (-) nasal stuffiness or itchiness, (-) postnasal drip, (-) nosebleeds, (-) sinus trouble. Mouth and Throat: (-) sore throat, (-) hoarseness. Neck: (-) swollen glands, (-) enlarged thyroid, (-) neck pain. Respiratory: - cough, (-) bloody sputum, - shortness of breath, - wheezing. Cardiovascular: - ankle swelling, (-) chest pain. Lymphatic: (-) lymph node enlargement. Neurologic: (-) numbness, (-) tingling. Psychiatric: (-) anxiety, (-) depression   Current Medication: Outpatient Encounter Medications as of 06/01/2020  Medication Sig  . allopurinol (ZYLOPRIM) 100 MG tablet Take 100 mg by mouth daily.  . ARIPiprazole (ABILIFY) 10 MG tablet Take 10 mg by mouth daily.  Marland Kitchen aspirin 81 MG tablet Take 81 mg by mouth daily.  . cholecalciferol (VITAMIN D) 1000 units tablet Take 1,000 Units by mouth daily.  Marland Kitchen Dexlansoprazole 30 MG capsule Take 30 mg by mouth daily.  Marland Kitchen escitalopram (LEXAPRO) 20 MG tablet Take 20 mg by mouth daily.  . folic acid (FOLVITE) 1 MG tablet Take 1 mg by mouth daily.  Marland Kitchen losartan (COZAAR) 50 MG tablet Take 50 mg by mouth daily.  . simvastatin (ZOCOR) 80 MG tablet Take 80 mg by mouth daily.  . tamsulosin (FLOMAX) 0.4 MG CAPS capsule Take 0.4 mg by mouth. Take 1 tab po daily  .  traZODone (DESYREL) 100 MG tablet Take 100 mg by mouth at bedtime.   No facility-administered encounter medications on file as of 06/01/2020.    Surgical History: Past Surgical History:  Procedure Laterality Date  . implant in mouth      Medical History: Past Medical History:  Diagnosis Date  . Allergy   . Sleep apnea     Family History: Family History  Problem Relation Age of Onset  . Colon cancer Sister   . Heart attack Mother   . Dementia Mother     Social History: Social History   Socioeconomic History  . Marital status: Married    Spouse name: Not on file  . Number of children: Not on file  . Years of education: Not on file  . Highest education level: Not on file  Occupational History  . Not on file  Tobacco Use  . Smoking status: Never Smoker  . Smokeless tobacco: Never Used  Vaping Use  . Vaping Use: Never used  Substance and Sexual Activity  . Alcohol use: No  . Drug use: No  . Sexual activity: Not on file  Other Topics Concern  . Not on file  Social History Narrative  . Not on file   Social Determinants of Health   Financial Resource Strain: Not on file  Food Insecurity: Not on file  Transportation Needs: Not on file  Physical Activity: Not on file  Stress: Not on file  Social Connections: Not on file  Intimate Partner Violence: Not on file  Vital Signs: Blood pressure 128/80, pulse 88, temperature 97.8 F (36.6 C), resp. rate 16, height 5\' 11"  (1.803 m), weight 212 lb (96.2 kg), SpO2 96 %.  Examination: General Appearance: The patient is well-developed, well-nourished, and in no distress. Skin: Gross inspection of skin unremarkable. Head: normocephalic, no gross deformities. Eyes: no gross deformities noted. ENT: ears appear grossly normal no exudates. Neck: Supple. No thyromegaly. No LAD. Respiratory: no rhonchi noted at thjis time. Cardiovascular: Normal S1 and S2 without murmur or rub. Extremities: No cyanosis. pulses are  equal. Neurologic: Alert and oriented. No involuntary movements.  LABS: No results found for this or any previous visit (from the past 2160 hour(s)).  Radiology: CT Chest High Resolution  Result Date: 10/21/2019 CLINICAL DATA:  Intermittent chest pains for 3-5 years.  COPD. EXAM: CT CHEST WITHOUT CONTRAST TECHNIQUE: Multidetector CT imaging of the chest was performed following the standard protocol without intravenous contrast. High resolution imaging of the lungs, as well as inspiratory and expiratory imaging, was performed. COMPARISON:  None. FINDINGS: Cardiovascular: Heart is at the upper limits of normal in size to mildly enlarged. No pericardial effusion. Mediastinum/Nodes: Subcentimeter low-attenuation lesion in the right lobe of the thyroid. No follow-up recommended (ref: J Am Coll Radiol. 2015 Feb;12(2): 143-50).No pathologically enlarged mediastinal or axillary lymph nodes. Hilar regions are difficult to definitively evaluate without IV contrast. Esophagus is grossly unremarkable. Lungs/Pleura: Mild bibasilar scarring. Negative for subpleural reticulation, traction bronchiectasis/bronchiolectasis, ground-glass, architectural distortion or honeycombing. Image quality is somewhat degraded by respiratory motion. No pleural fluid. Airway is unremarkable. Expiratory phase imaging was not performed in true expiration, limiting the evaluation for air trapping. Upper Abdomen: There may be a vague 1.9 cm low-attenuation lesion in the posteromedial right hepatic lobe. Further characterization is limited without IV contrast. Visualized portions of the liver, gallbladder, adrenal glands, kidneys, spleen, pancreas, stomach and bowel are otherwise unremarkable. Musculoskeletal: Degenerative changes in the spine. No worrisome lytic or sclerotic lesions. IMPRESSION: No evidence of interstitial lung disease. Electronically Signed   By: 10/23/2019 M.D.   On: 10/21/2019 14:42    No results found.  No  results found.    Assessment and Plan: Patient Active Problem List   Diagnosis Date Noted  . Encounter for general adult medical examination with abnormal findings 10/15/2017  . Essential hypertension, benign 10/15/2017  . Post traumatic stress disorder 10/15/2017  . Dysuria 10/15/2017  . Obstructive sleep apnea 04/26/2017    1. OSA (obstructive sleep apnea) On PAP therapy. He will continue with current pressure settings  2. CPAP use counseling CPAP Counseling: had a lengthy discussion with the patient regarding the importance of PAP therapy in management of the sleep apnea. Patient appears to understand the risk factor reduction and also understands the risks associated with untreated sleep apnea. Patient will try to make a good faith effort to remain compliant with therapy. Also instructed the patient on proper cleaning of the device including the water must be changed daily if possible and use of distilled water is preferred. Patient understands that the machine should be regularly cleaned with appropriate recommended cleaning solutions that do not damage the PAP machine for example given white vinegar and water rinses. Other methods such as ozone treatment may not be as good as these simple methods to achieve cleaning.  3. Essential hypertension   Hypertension Counseling:   The following hypertensive lifestyle modification were recommended and discussed:  1. Limiting alcohol intake to less than 1 oz/day of ethanol:(24 oz of beer or  8 oz of wine or 2 oz of 100-proof whiskey). 2. Take baby ASA 81 mg daily. 3. Importance of regular aerobic exercise and losing weight. 4. Reduce dietary saturated fat and cholesterol intake for overall cardiovascular health. 5. Maintaining adequate dietary potassium, calcium, and magnesium intake. 6. Regular monitoring of the blood pressure. 7. Reduce sodium intake to less than 100 mmol/day (less than 2.3 gm of sodium or less than 6 gm of sodium choride)    General Counseling: I have discussed the findings of the evaluation and examination with Kevin Fenton.  I have also discussed any further diagnostic evaluation thatmay be needed or ordered today. Jolon verbalizes understanding of the findings of todays visit. We also reviewed his medications today and discussed drug interactions and side effects including but not limited excessive drowsiness and altered mental states. We also discussed that there is always a risk not just to him but also people around him. he has been encouraged to call the office with any questions or concerns that should arise related to todays visit.  No orders of the defined types were placed in this encounter.    Time spent: 32  I have personally obtained a history, examined the patient, evaluated laboratory and imaging results, formulated the assessment and plan and placed orders.    Yevonne Pax, MD [facility] Pulmonary and Critical Care Sleep medicine

## 2020-06-01 NOTE — Patient Instructions (Signed)

## 2020-07-29 ENCOUNTER — Ambulatory Visit (INDEPENDENT_AMBULATORY_CARE_PROVIDER_SITE_OTHER): Payer: Medicare PPO

## 2020-07-29 ENCOUNTER — Other Ambulatory Visit: Payer: Self-pay

## 2020-07-29 DIAGNOSIS — G4733 Obstructive sleep apnea (adult) (pediatric): Secondary | ICD-10-CM | POA: Diagnosis not present

## 2020-07-29 NOTE — Progress Notes (Signed)
95 percentile pressure 7   95th percentile leak 6.7   apnea index 0.3 /hr  apnea-hypopnea index  0.6 /hr   total days used  >4 hr 89 days  total days used <4 hr 0 days  Total compliance 99 percent  He is doing great no problems on cpap

## 2020-12-03 ENCOUNTER — Ambulatory Visit: Payer: TRICARE For Life (TFL) | Admitting: Physician Assistant

## 2020-12-14 ENCOUNTER — Other Ambulatory Visit: Payer: Self-pay

## 2020-12-14 ENCOUNTER — Encounter: Payer: Self-pay | Admitting: Physician Assistant

## 2020-12-14 ENCOUNTER — Ambulatory Visit (INDEPENDENT_AMBULATORY_CARE_PROVIDER_SITE_OTHER): Payer: Medicare PPO | Admitting: Physician Assistant

## 2020-12-14 VITALS — BP 134/75 | HR 63 | Temp 98.3°F | Resp 16 | Ht 71.0 in | Wt 209.0 lb

## 2020-12-14 DIAGNOSIS — I10 Essential (primary) hypertension: Secondary | ICD-10-CM

## 2020-12-14 DIAGNOSIS — G4733 Obstructive sleep apnea (adult) (pediatric): Secondary | ICD-10-CM

## 2020-12-14 DIAGNOSIS — Z7189 Other specified counseling: Secondary | ICD-10-CM | POA: Diagnosis not present

## 2020-12-14 NOTE — Progress Notes (Signed)
[facility] [address] [place], [place] [address]  Pulmonary Sleep Medicine   Office Visit Note  Patient Name: Bradley Gallagher DOB: 01/16/1956 MRN [id number]  Date of Service: 12/20/2020  Complaints/HPI: Pt is here for routine pulmonary follow up. He wears his CPAP nightly. He states he is due for replacement machine in Nov with [facility]--will need to verify when he becomes eligible with [facility] Endo. He denies any SOB, headaches, or dryness. His apneas are well controlled with AHI of 0.6 and he has excellent compliance at 99%.   No longer in prediabetic range on last A1c and BP well controlled. He exercises regularly.  ROS  General: (-) fever, (-) chills, (-) night sweats, (-) weakness Skin: (-) rashes, (-) itching,. Eyes: (-) visual changes, (-) redness, (-) itching. Nose and Sinuses: (-) nasal stuffiness or itchiness, (-) postnasal drip, (-) nosebleeds, (-) sinus trouble. Mouth and Throat: (-) sore throat, (-) hoarseness. Neck: (-) swollen glands, (-) enlarged thyroid, (-) neck pain. Respiratory: - cough, (-) bloody sputum, - shortness of breath, - wheezing. Cardiovascular: - ankle swelling, (-) chest pain. Lymphatic: (-) lymph node enlargement. Neurologic: (-) numbness, (-) tingling. Psychiatric: (-) anxiety, (-) depression   Current Medication: Outpatient Encounter Medications as of 12/14/2020  Medication Sig   ARIPiprazole (ABILIFY) 10 MG tablet Take 10 mg by mouth daily.   aspirin 81 MG tablet Take 81 mg by mouth daily.   cholecalciferol (VITAMIN D) 1000 units tablet Take 1,000 Units by mouth daily.   Dexlansoprazole 30 MG capsule Take 30 mg by mouth daily.   escitalopram (LEXAPRO) 20 MG tablet Take 20 mg by mouth daily.   folic acid (FOLVITE) 1 MG tablet Take 1 mg by mouth daily.   losartan (COZAAR) 50 MG tablet Take 50 mg by mouth daily.   simvastatin (ZOCOR) 80 MG tablet Take 80 mg by mouth daily.   tamsulosin (FLOMAX) 0.4 MG CAPS capsule Take 0.4 mg by mouth. Take 1  tab po daily   traZODone (DESYREL) 100 MG tablet Take 100 mg by mouth at bedtime.   [DISCONTINUED] allopurinol (ZYLOPRIM) 100 MG tablet Take 100 mg by mouth daily. (Patient not taking: Reported on 12/14/2020)   No facility-administered encounter medications on file as of 12/14/2020.    Surgical History: Past Surgical History:  Procedure Laterality Date   implant in mouth      Medical History: Past Medical History:  Diagnosis Date   Allergy    Sleep apnea     Family History: Family History  Problem Relation Age of Onset   Colon cancer Sister    Heart attack Mother    Dementia Mother     Social History: Social History   Socioeconomic History   Marital status: Married    Spouse name: Not on file   Number of children: Not on file   Years of education: Not on file   Highest education level: Not on file  Occupational History   Not on file  Tobacco Use   Smoking status: Never   Smokeless tobacco: Never  Vaping Use   Vaping Use: Never used  Substance and Sexual Activity   Alcohol use: No   Drug use: No   Sexual activity: Not on file  Other Topics Concern   Not on file  Social History Narrative   Not on file   Social Determinants of Health   Financial Resource Strain: Not on file  Food Insecurity: Not on file  Transportation Needs: Not on file  Physical Activity:  Not on file  Stress: Not on file  Social Connections: Not on file  Intimate Partner Violence: Not on file    Vital Signs: Blood pressure 134/75, pulse 63, temperature 98.3 F (36.8 C), resp. rate 16, height 5\' 11"  (1.803 m), weight 209 lb (94.8 kg), SpO2 99 %.  Examination: General Appearance: The patient is well-developed, well-nourished, and in no distress. Skin: Gross inspection of skin unremarkable. Head: normocephalic, no gross deformities. Eyes: no gross deformities noted. ENT: ears appear grossly normal no exudates. Neck: Supple. No thyromegaly. No LAD. Respiratory: Lungs clear to  auscultation bilaterally. Cardiovascular: Normal S1 and S2 without murmur or rub. Extremities: No cyanosis. pulses are equal. Neurologic: Alert and oriented. No involuntary movements.  LABS: No results found for this or any previous visit (from the past 2160 hour(s)).  Radiology: CT Chest High Resolution  Result Date: 10/21/2019 CLINICAL DATA:  Intermittent chest pains for 3-5 years.  COPD. EXAM: CT CHEST WITHOUT CONTRAST TECHNIQUE: Multidetector CT imaging of the chest was performed following the standard protocol without intravenous contrast. High resolution imaging of the lungs, as well as inspiratory and expiratory imaging, was performed. COMPARISON:  None. FINDINGS: Cardiovascular: Heart is at the upper limits of normal in size to mildly enlarged. No pericardial effusion. Mediastinum/Nodes: Subcentimeter low-attenuation lesion in the right lobe of the thyroid. No follow-up recommended (ref: J Am Coll Radiol. 2015 Feb;12(2): 143-50).No pathologically enlarged mediastinal or axillary lymph nodes. Hilar regions are difficult to definitively evaluate without IV contrast. Esophagus is grossly unremarkable. Lungs/Pleura: Mild bibasilar scarring. Negative for subpleural reticulation, traction bronchiectasis/bronchiolectasis, ground-glass, architectural distortion or honeycombing. Image quality is somewhat degraded by respiratory motion. No pleural fluid. Airway is unremarkable. Expiratory phase imaging was not performed in true expiration, limiting the evaluation for air trapping. Upper Abdomen: There may be a vague 1.9 cm low-attenuation lesion in the posteromedial right hepatic lobe. Further characterization is limited without IV contrast. Visualized portions of the liver, gallbladder, adrenal glands, kidneys, spleen, pancreas, stomach and bowel are otherwise unremarkable. Musculoskeletal: Degenerative changes in the spine. No worrisome lytic or sclerotic lesions. IMPRESSION: No evidence of interstitial  lung disease. Electronically Signed   By: 10/23/2019 M.D.   On: 10/21/2019 14:42    No results found.  No results found.    Assessment and Plan: Patient Active Problem List   Diagnosis Date Noted   Encounter for general adult medical examination with abnormal findings 10/15/2017   Essential hypertension, benign 10/15/2017   Post traumatic stress disorder 10/15/2017   Dysuria 10/15/2017   Obstructive sleep apnea 04/26/2017    1. OSA (obstructive sleep apnea) Continue excellent compliance  2. CPAP use counseling CPAP couseling-Discussed importance of adequate CPAP use as well as proper care and cleaning techniques of machine and all supplies.  3. Essential hypertension Continue current medication and f/u with PCP.    General Counseling: I have discussed the findings of the evaluation and examination with 04/28/2017.  I have also discussed any further diagnostic evaluation thatmay be needed or ordered today. Fortunato verbalizes understanding of the findings of todays visit. We also reviewed his medications today and discussed drug interactions and side effects including but not limited excessive drowsiness and altered mental states. We also discussed that there is always a risk not just to him but also people around him. he has been encouraged to call the office with any questions or concerns that should arise related to todays visit.  No orders of the defined types were placed in this encounter.  Time spent: 30  I have personally obtained a history, examined the patient, evaluated laboratory and imaging results, formulated the assessment and plan and placed orders. This patient was seen by Lynn Ito, PA-C in collaboration with Dr. Freda Munro as a part of collaborative care agreement.     Yevonne Pax, MD [facility] Pulmonary and Critical Care Sleep medicine

## 2020-12-14 NOTE — Patient Instructions (Signed)

## 2021-01-27 ENCOUNTER — Ambulatory Visit: Payer: Medicare PPO

## 2021-03-17 ENCOUNTER — Ambulatory Visit (INDEPENDENT_AMBULATORY_CARE_PROVIDER_SITE_OTHER): Payer: Medicare PPO

## 2021-03-17 ENCOUNTER — Other Ambulatory Visit: Payer: Self-pay

## 2021-03-17 DIAGNOSIS — G4733 Obstructive sleep apnea (adult) (pediatric): Secondary | ICD-10-CM | POA: Diagnosis not present

## 2021-03-17 NOTE — Progress Notes (Signed)
95 percentile pressure 7   95th percentile leak 14.2   apnea index 0.6 /hr  apnea-hypopnea index  0.9 /hr   total days used  >4 hr 89 days  total days used <4 hr 1 days  Total compliance 99 percent  He is doing great. Cpap is over 65 years old and making loud noise. He is scheduling to see Dr Welton Flakes for new cpap  Pt was seen by Tresa Endo  RRT/RCP  from [facility]

## 2021-04-05 ENCOUNTER — Telehealth: Payer: Self-pay

## 2021-04-05 ENCOUNTER — Other Ambulatory Visit: Payer: Self-pay

## 2021-04-05 ENCOUNTER — Encounter: Payer: Self-pay | Admitting: Internal Medicine

## 2021-04-05 ENCOUNTER — Ambulatory Visit (INDEPENDENT_AMBULATORY_CARE_PROVIDER_SITE_OTHER): Payer: Medicare PPO | Admitting: Internal Medicine

## 2021-04-05 VITALS — BP 136/82 | HR 82 | Temp 98.4°F | Resp 16 | Ht 71.5 in | Wt 210.6 lb

## 2021-04-05 DIAGNOSIS — Z9989 Dependence on other enabling machines and devices: Secondary | ICD-10-CM | POA: Diagnosis not present

## 2021-04-05 DIAGNOSIS — J4520 Mild intermittent asthma, uncomplicated: Secondary | ICD-10-CM

## 2021-04-05 DIAGNOSIS — Z7189 Other specified counseling: Secondary | ICD-10-CM | POA: Diagnosis not present

## 2021-04-05 DIAGNOSIS — G4733 Obstructive sleep apnea (adult) (pediatric): Secondary | ICD-10-CM | POA: Diagnosis not present

## 2021-04-05 NOTE — Telephone Encounter (Signed)
Community message sent to Sarah with [facility] for Cpap machine °

## 2021-04-05 NOTE — Telephone Encounter (Signed)
Community message sent to Sarah with [facility] for Cpap machine

## 2021-04-05 NOTE — Progress Notes (Signed)
[facility] [address] [place], [place] [address]  Pulmonary Sleep Medicine   Office Visit Note  Patient Name: Bradley Gallagher DOB: 30-Dec-1955 MRN [id number]  Date of Service: 04/05/2021  Complaints/HPI: OSA using his CPAP as prescribed. He is supposed to get a new machine but there has been a waiting list for the machines. Patient states his time has now come to get the new PAP.  His current machine is working but not optimally.  Patient states that he could probably get better benefit from new machine because of the some issues with getting the amount of pressure that he needs.  No sinus congestion no admissions to the hospital denies any chest pain no palpitations.  ROS  General: (-) fever, (-) chills, (-) night sweats, (-) weakness Skin: (-) rashes, (-) itching,. Eyes: (-) visual changes, (-) redness, (-) itching. Nose and Sinuses: (-) nasal stuffiness or itchiness, (-) postnasal drip, (-) nosebleeds, (-) sinus trouble. Mouth and Throat: (-) sore throat, (-) hoarseness. Neck: (-) swollen glands, (-) enlarged thyroid, (-) neck pain. Respiratory: - cough, (-) bloody sputum, - shortness of breath, - wheezing. Cardiovascular: - ankle swelling, (-) chest pain. Lymphatic: (-) lymph node enlargement. Neurologic: (-) numbness, (-) tingling. Psychiatric: (-) anxiety, (-) depression   Current Medication: Outpatient Encounter Medications as of 04/05/2021  Medication Sig   ARIPiprazole (ABILIFY) 10 MG tablet Take 10 mg by mouth daily.   aspirin 81 MG tablet Take 81 mg by mouth daily.   cholecalciferol (VITAMIN D) 1000 units tablet Take 1,000 Units by mouth daily.   Dexlansoprazole 30 MG capsule Take 30 mg by mouth daily.   escitalopram (LEXAPRO) 20 MG tablet Take 20 mg by mouth daily.   folic acid (FOLVITE) 1 MG tablet Take 1 mg by mouth daily.   losartan (COZAAR) 50 MG tablet Take 50 mg by mouth daily.   simvastatin (ZOCOR) 80 MG tablet Take 80 mg by mouth daily.   tamsulosin  (FLOMAX) 0.4 MG CAPS capsule Take 0.4 mg by mouth. Take 1 tab po daily   traZODone (DESYREL) 100 MG tablet Take 100 mg by mouth at bedtime.   No facility-administered encounter medications on file as of 04/05/2021.    Surgical History: Past Surgical History:  Procedure Laterality Date   implant in mouth      Medical History: Past Medical History:  Diagnosis Date   Allergy    Sleep apnea     Family History: Family History  Problem Relation Age of Onset   Colon cancer Sister    Heart attack Mother    Dementia Mother     Social History: Social History   Socioeconomic History   Marital status: Married    Spouse name: Not on file   Number of children: Not on file   Years of education: Not on file   Highest education level: Not on file  Occupational History   Not on file  Tobacco Use   Smoking status: Never   Smokeless tobacco: Never  Vaping Use   Vaping Use: Never used  Substance and Sexual Activity   Alcohol use: No   Drug use: No   Sexual activity: Not on file  Other Topics Concern   Not on file  Social History Narrative   Not on file   Social Determinants of Health   Financial Resource Strain: Not on file  Food Insecurity: Not on file  Transportation Needs: Not on file  Physical Activity: Not on file  Stress: Not on  file  Social Connections: Not on file  Intimate Partner Violence: Not on file    Vital Signs: Blood pressure 136/82, pulse 82, temperature 98.4 F (36.9 C), resp. rate 16, height 5' 11.5" (1.816 m), weight 210 lb 9.6 oz (95.5 kg), SpO2 98 %.  Examination: General Appearance: The patient is well-developed, well-nourished, and in no distress. Skin: Gross inspection of skin unremarkable. Head: normocephalic, no gross deformities. Eyes: no gross deformities noted. ENT: ears appear grossly normal no exudates. Neck: Supple. No thyromegaly. No LAD. Respiratory: no rhonchi noted. Cardiovascular: Normal S1 and S2 without murmur or  rub. Extremities: No cyanosis. pulses are equal. Neurologic: Alert and oriented. No involuntary movements.  LABS: No results found for this or any previous visit (from the past 2160 hour(s)).  Radiology: CT Chest High Resolution  Result Date: 10/21/2019 CLINICAL DATA:  Intermittent chest pains for 3-5 years.  COPD. EXAM: CT CHEST WITHOUT CONTRAST TECHNIQUE: Multidetector CT imaging of the chest was performed following the standard protocol without intravenous contrast. High resolution imaging of the lungs, as well as inspiratory and expiratory imaging, was performed. COMPARISON:  None. FINDINGS: Cardiovascular: Heart is at the upper limits of normal in size to mildly enlarged. No pericardial effusion. Mediastinum/Nodes: Subcentimeter low-attenuation lesion in the right lobe of the thyroid. No follow-up recommended (ref: J Am Coll Radiol. 2015 Feb;12(2): 143-50).No pathologically enlarged mediastinal or axillary lymph nodes. Hilar regions are difficult to definitively evaluate without IV contrast. Esophagus is grossly unremarkable. Lungs/Pleura: Mild bibasilar scarring. Negative for subpleural reticulation, traction bronchiectasis/bronchiolectasis, ground-glass, architectural distortion or honeycombing. Image quality is somewhat degraded by respiratory motion. No pleural fluid. Airway is unremarkable. Expiratory phase imaging was not performed in true expiration, limiting the evaluation for air trapping. Upper Abdomen: There may be a vague 1.9 cm low-attenuation lesion in the posteromedial right hepatic lobe. Further characterization is limited without IV contrast. Visualized portions of the liver, gallbladder, adrenal glands, kidneys, spleen, pancreas, stomach and bowel are otherwise unremarkable. Musculoskeletal: Degenerative changes in the spine. No worrisome lytic or sclerotic lesions. IMPRESSION: No evidence of interstitial lung disease. Electronically Signed   By: Leanna Battles M.D.   On: 10/21/2019  14:42    No results found.  No results found.    Assessment and Plan: Patient Active Problem List   Diagnosis Date Noted   Encounter for general adult medical examination with abnormal findings 10/15/2017   Essential hypertension, benign 10/15/2017   Post traumatic stress disorder 10/15/2017   Dysuria 10/15/2017   Obstructive sleep apnea 04/26/2017    1. Obstructive sleep apnea Patient needs to continue with CPAP therapy prescription for new machine will be written today and he will get that filled by his DME provider - For home use only DME continuous positive airway pressure (CPAP)  2. OSA on CPAP Continue with the CPAP on the current pressures  3. Mild intermittent asthma without complication This is under control  4. CPAP use counseling CPAP Counseling: had a lengthy discussion with the patient regarding the importance of PAP therapy in management of the sleep apnea. Patient appears to understand the risk factor reduction and also understands the risks associated with untreated sleep apnea. Patient will try to make a good faith effort to remain compliant with therapy. Also instructed the patient on proper cleaning of the device including the water must be changed daily if possible and use of distilled water is preferred. Patient understands that the machine should be regularly cleaned with appropriate recommended cleaning solutions that do not  damage the PAP machine for example given white vinegar and water rinses. Other methods such as ozone treatment may not be as good as these simple methods to achieve cleaning.    General Counseling: I have discussed the findings of the evaluation and examination with [name].  I have also discussed any further diagnostic evaluation thatmay be needed or ordered today. [name] verbalizes understanding of the findings of todays visit. We also reviewed his medications today and discussed drug interactions and side effects including but not limited  excessive drowsiness and altered mental states. We also discussed that there is always a risk not just to him but also people around him. he has been encouraged to call the office with any questions or concerns that should arise related to todays visit.  No orders of the defined types were placed in this encounter.    Time spent: 8340  I have personally obtained a history, examined the patient, evaluated laboratory and imaging results, formulated the assessment and plan and placed orders.    Yevonne PaxSaadat A Jaquay Posthumus, MD [facility] Pulmonary and Critical Care Sleep medicine

## 2021-04-09 ENCOUNTER — Telehealth: Payer: Self-pay

## 2021-04-09 NOTE — Telephone Encounter (Signed)
Beth from [facility] called and advised that they couldn't do pt for his CPAP due to him living in [place].  I called pt and he advised that he has been using [facility] in [place] for his supplies and I called them and they verified pt received his CPAP machine from them in 2017

## 2021-04-09 NOTE — Telephone Encounter (Signed)
Pt uses [facility] FOR CPAP AND HIS SUPPLIES PH: [phone] FAX: [phone] ATTN: AMBER What they require for new CPAP machine or supplies: WRITTEN PRESCRIPTION, CPAP SETTINGS, DEMOGRAPHICS, FACE TO FACE OFFICE NOTES  Spoke to pt and he gave me the information where he receives his CPAP supplies and he also received his CPAP machine from there in 2017.

## 2021-04-16 ENCOUNTER — Telehealth: Payer: Self-pay

## 2021-04-16 NOTE — Telephone Encounter (Signed)
CMN signed for cpap supplies and faxed back to [facility] at [phone]. Placed in home health folder at front desk.

## 2021-06-14 ENCOUNTER — Ambulatory Visit (INDEPENDENT_AMBULATORY_CARE_PROVIDER_SITE_OTHER): Payer: Medicare PPO | Admitting: Physician Assistant

## 2021-06-14 ENCOUNTER — Encounter: Payer: Self-pay | Admitting: Physician Assistant

## 2021-06-14 ENCOUNTER — Other Ambulatory Visit: Payer: Self-pay

## 2021-06-14 VITALS — BP 139/73 | HR 75 | Temp 98.3°F | Resp 16 | Ht 71.5 in | Wt 212.0 lb

## 2021-06-14 DIAGNOSIS — Z7189 Other specified counseling: Secondary | ICD-10-CM | POA: Diagnosis not present

## 2021-06-14 DIAGNOSIS — G4733 Obstructive sleep apnea (adult) (pediatric): Secondary | ICD-10-CM | POA: Diagnosis not present

## 2021-06-14 DIAGNOSIS — I10 Essential (primary) hypertension: Secondary | ICD-10-CM

## 2021-06-14 NOTE — Patient Instructions (Signed)

## 2021-06-14 NOTE — Progress Notes (Signed)
[facility] ?[address] ?[place], [place] [address] ? ?Pulmonary Sleep Medicine  ? ?Office Visit Note ? ?Patient Name: Bradley Gallagher ?DOB: 24-Mar-1956 ?MRN [id number] ? ?Date of Service: 06/14/2021 ? ?Complaints/HPI: Pt is here for routine follow up.  Just got his new machine, but has not started using it yet.  Does not have download available to review from either machine.  He does mention that he plans to continue to use his old machine in addition to his new machine due to living in 2 different locations, to avoid bringing machine when traveling between homes.  He will be due for an appointment with Tresa Endo for download this spring and was advised to go ahead and bring both machines just in case.  Denies any headache, dryness, shortness of breath.  He is benefiting from PAP use and reports nightly use. ? ?ROS ? ?General: (-) fever, (-) chills, (-) night sweats, (-) weakness ?Skin: (-) rashes, (-) itching,. ?Eyes: (-) visual changes, (-) redness, (-) itching. ?Nose and Sinuses: (-) nasal stuffiness or itchiness, (-) postnasal drip, (-) nosebleeds, (-) sinus trouble. ?Mouth and Throat: (-) sore throat, (-) hoarseness. ?Neck: (-) swollen glands, (-) enlarged thyroid, (-) neck pain. ?Respiratory: - cough, (-) bloody sputum, - shortness of breath, - wheezing. ?Cardiovascular: - ankle swelling, (-) chest pain. ?Lymphatic: (-) lymph node enlargement. ?Neurologic: (-) numbness, (-) tingling. ?Psychiatric: (-) anxiety, (-) depression ? ? ?Current Medication: ?Outpatient Encounter Medications as of 06/14/2021  ?Medication Sig  ? ARIPiprazole (ABILIFY) 10 MG tablet Take 10 mg by mouth daily.  ? aspirin 81 MG tablet Take 81 mg by mouth daily.  ? cholecalciferol (VITAMIN D) 1000 units tablet Take 1,000 Units by mouth daily.  ? Dexlansoprazole 30 MG capsule Take 30 mg by mouth daily.  ? escitalopram (LEXAPRO) 20 MG tablet Take 20 mg by mouth daily.  ? folic acid (FOLVITE) 1 MG tablet Take 1 mg by mouth daily.  ?  losartan (COZAAR) 50 MG tablet Take 50 mg by mouth daily.  ? simvastatin (ZOCOR) 80 MG tablet Take 80 mg by mouth daily.  ? tamsulosin (FLOMAX) 0.4 MG CAPS capsule Take 0.4 mg by mouth. Take 1 tab po daily  ? traZODone (DESYREL) 100 MG tablet Take 100 mg by mouth at bedtime.  ? ?No facility-administered encounter medications on file as of 06/14/2021.  ? ? ?Surgical History: ?Past Surgical History:  ?Procedure Laterality Date  ? implant in mouth    ? ? ?Medical History: ?Past Medical History:  ?Diagnosis Date  ? Allergy   ? Sleep apnea   ? ? ?Family History: ?Family History  ?Problem Relation Age of Onset  ? Colon cancer Sister   ? Heart attack Mother   ? Dementia Mother   ? ? ?Social History: ?Social History  ? ?Socioeconomic History  ? Marital status: Married  ?  Spouse name: Not on file  ? Number of children: Not on file  ? Years of education: Not on file  ? Highest education level: Not on file  ?Occupational History  ? Not on file  ?Tobacco Use  ? Smoking status: Never  ? Smokeless tobacco: Never  ?Vaping Use  ? Vaping Use: Never used  ?Substance and Sexual Activity  ? Alcohol use: No  ? Drug use: No  ? Sexual activity: Not on file  ?Other Topics Concern  ? Not on file  ?Social History Narrative  ? Not on file  ? ?Social Determinants of Health  ? ?Financial Resource Strain:  Not on file  ?Food Insecurity: Not on file  ?Transportation Needs: Not on file  ?Physical Activity: Not on file  ?Stress: Not on file  ?Social Connections: Not on file  ?Intimate Partner Violence: Not on file  ? ? ?Vital Signs: ?Blood pressure 139/73, pulse 75, temperature 98.3 ?F (36.8 ?C), resp. rate 16, height 5' 11.5" (1.816 m), weight 212 lb (96.2 kg), SpO2 98 %. ? ?Examination: ?General Appearance: The patient is well-developed, well-nourished, and in no distress. ?Skin: Gross inspection of skin unremarkable. ?Head: normocephalic, no gross deformities. ?Eyes: no gross deformities noted. ?ENT: ears appear grossly normal no exudates. ?Neck:  Supple. No thyromegaly. No LAD. ?Respiratory: Lungs clear to auscultation bilaterally. ?Cardiovascular: Normal S1 and S2 without murmur or rub. ?Extremities: No cyanosis. pulses are equal. ?Neurologic: Alert and oriented. No involuntary movements. ? ?LABS: ?No results found for this or any previous visit (from the past 2160 hour(s)). ? ?Radiology: ?CT Chest High Resolution ? ?Result Date: 10/21/2019 ?CLINICAL DATA:  Intermittent chest pains for 3-5 years.  COPD. EXAM: CT CHEST WITHOUT CONTRAST TECHNIQUE: Multidetector CT imaging of the chest was performed following the standard protocol without intravenous contrast. High resolution imaging of the lungs, as well as inspiratory and expiratory imaging, was performed. COMPARISON:  None. FINDINGS: Cardiovascular: Heart is at the upper limits of normal in size to mildly enlarged. No pericardial effusion. Mediastinum/Nodes: Subcentimeter low-attenuation lesion in the right lobe of the thyroid. No follow-up recommended (ref: J Am Coll Radiol. 2015 Feb;12(2): 143-50).No pathologically enlarged mediastinal or axillary lymph nodes. Hilar regions are difficult to definitively evaluate without IV contrast. Esophagus is grossly unremarkable. Lungs/Pleura: Mild bibasilar scarring. Negative for subpleural reticulation, traction bronchiectasis/bronchiolectasis, ground-glass, architectural distortion or honeycombing. Image quality is somewhat degraded by respiratory motion. No pleural fluid. Airway is unremarkable. Expiratory phase imaging was not performed in true expiration, limiting the evaluation for air trapping. Upper Abdomen: There may be a vague 1.9 cm low-attenuation lesion in the posteromedial right hepatic lobe. Further characterization is limited without IV contrast. Visualized portions of the liver, gallbladder, adrenal glands, kidneys, spleen, pancreas, stomach and bowel are otherwise unremarkable. Musculoskeletal: Degenerative changes in the spine. No worrisome lytic or  sclerotic lesions. IMPRESSION: No evidence of interstitial lung disease. Electronically Signed   By: Leanna BattlesMelinda  Blietz M.D.   On: 10/21/2019 14:42  ? ? ?No results found. ? ?No results found. ? ? ? ?Assessment and Plan: ?Patient Active Problem List  ? Diagnosis Date Noted  ? Encounter for general adult medical examination with abnormal findings 10/15/2017  ? Essential hypertension, benign 10/15/2017  ? Post traumatic stress disorder 10/15/2017  ? Dysuria 10/15/2017  ? Obstructive sleep apnea 04/26/2017  ? ? ?1. Obstructive sleep apnea ?Continue CPAP nightly ? ?2. CPAP use counseling ?CPAP couseling-Discussed importance of adequate CPAP use as well as proper care and cleaning techniques of machine and all supplies. ? ?3. Essential hypertension ?Well-controlled, followed by PCP ? ? ? ? ?General Counseling: I have discussed the findings of the evaluation and examination with [name].  I have also discussed any further diagnostic evaluation thatmay be needed or ordered today. [name] verbalizes understanding of the findings of todays visit. We also reviewed his medications today and discussed drug interactions and side effects including but not limited excessive drowsiness and altered mental states. We also discussed that there is always a risk not just to him but also people around him. he has been encouraged to call the office with any questions or concerns that should arise related  to todays visit. ? ?No orders of the defined types were placed in this encounter. ?  ? ?Time spent: 30 ? ?I have personally obtained a history, examined the patient, evaluated laboratory and imaging results, formulated the assessment and plan and placed orders. ?This patient was seen by Lynn Ito, PA-C in collaboration with Dr. Freda Munro as a part of collaborative care agreement. ? ? ?  ?Yevonne Pax, MD FCCP ?Pulmonary and Critical Care ?Sleep medicine ?

## 2021-09-01 ENCOUNTER — Ambulatory Visit (INDEPENDENT_AMBULATORY_CARE_PROVIDER_SITE_OTHER): Payer: Medicare PPO

## 2021-09-01 DIAGNOSIS — G4733 Obstructive sleep apnea (adult) (pediatric): Secondary | ICD-10-CM | POA: Diagnosis not present

## 2021-09-01 NOTE — Progress Notes (Signed)
95 percentile pressure 9.7   95th percentile leak 17.4   apnea index 0.3 /hr  apnea-hypopnea index  0.6 /hr   total days used  >4 hr 50 days  total days used <4 hr 0 days  Total compliance 100 percent  He is doing great no problems or questions at this time Pt was seen by Tresa Endo  RRT/RCP  from [facility]

## 2021-12-13 ENCOUNTER — Ambulatory Visit (INDEPENDENT_AMBULATORY_CARE_PROVIDER_SITE_OTHER): Payer: Medicare PPO | Admitting: Physician Assistant

## 2021-12-13 ENCOUNTER — Encounter: Payer: Self-pay | Admitting: Physician Assistant

## 2021-12-13 VITALS — BP 134/72 | HR 62 | Temp 97.8°F | Resp 16 | Ht 71.5 in | Wt 207.0 lb

## 2021-12-13 DIAGNOSIS — Z7189 Other specified counseling: Secondary | ICD-10-CM

## 2021-12-13 DIAGNOSIS — I10 Essential (primary) hypertension: Secondary | ICD-10-CM | POA: Diagnosis not present

## 2021-12-13 DIAGNOSIS — G4733 Obstructive sleep apnea (adult) (pediatric): Secondary | ICD-10-CM | POA: Diagnosis not present

## 2021-12-13 NOTE — Progress Notes (Signed)
[facility] [place], [place] [address]  Pulmonary Sleep Medicine   Office Visit Note  Patient Name: Bradley Gallagher DOB: June 26, 1955 MRN [id number]  Date of Service: 12/13/2021  Complaints/HPI: Pt is here for routine pulmonary follow up for [place] on CPAP. Was using [employer] in [place] for DME, but may look into alternative. He is having problems with getting supplies and states he will get incomplete supplies. He will notify office where to send script. He does use two machines to use in his different locations. He also reports getting calls from a sleep coach that he doesn't understand why he keeps getting these. He is ignoring the calls now. He thinks this is also something associated with the DME company, as it isn't anything our office is aware of. Denies any SOB, dryness, headaches. His download shows 100% compliance and AHI of 0.6. Feels well rested after using PAP.  ROS  General: (-) fever, (-) chills, (-) night sweats, (-) weakness Skin: (-) rashes, (-) itching,. Eyes: (-) visual changes, (-) redness, (-) itching. Nose and Sinuses: (-) nasal stuffiness or itchiness, (-) postnasal drip, (-) nosebleeds, (-) sinus trouble. Mouth and Throat: (-) sore throat, (-) hoarseness. Neck: (-) swollen glands, (-) enlarged thyroid, (-) neck pain. Respiratory: - cough, (-) bloody sputum, - shortness of breath, - wheezing. Cardiovascular: - ankle swelling, (-) chest pain. Lymphatic: (-) lymph node enlargement. Neurologic: (-) numbness, (-) tingling. Psychiatric: (-) anxiety, (-) depression   Current Medication: Outpatient Encounter Medications as of 12/13/2021  Medication Sig   ARIPiprazole (ABILIFY) 10 MG tablet Take 10 mg by mouth daily.   aspirin 81 MG tablet Take 81 mg by mouth daily.   cholecalciferol (VITAMIN D) 1000 units tablet Take 1,000 Units by mouth daily.   Dexlansoprazole 30 MG capsule Take 30 mg by mouth daily.   escitalopram (LEXAPRO) 20 MG tablet  Take 20 mg by mouth daily.   folic acid (FOLVITE) 1 MG tablet Take 1 mg by mouth daily.   losartan (COZAAR) 50 MG tablet Take 50 mg by mouth daily.   simvastatin (ZOCOR) 80 MG tablet Take 80 mg by mouth daily.   tamsulosin (FLOMAX) 0.4 MG CAPS capsule Take 0.4 mg by mouth. Take 1 tab po daily   traZODone (DESYREL) 100 MG tablet Take 100 mg by mouth at bedtime.   No facility-administered encounter medications on file as of 12/13/2021.    Surgical History: Past Surgical History:  Procedure Laterality Date   implant in mouth      Medical History: Past Medical History:  Diagnosis Date   Allergy    Sleep apnea     Family History: Family History  Problem Relation Age of Onset   Colon cancer Sister    Heart attack Mother    Dementia Mother     Social History: Social History   Socioeconomic History   Marital status: Married    Spouse name: Not on file   Number of children: Not on file   Years of education: Not on file   Highest education level: Not on file  Occupational History   Not on file  Tobacco Use   Smoking status: Never   Smokeless tobacco: Never  Vaping Use   Vaping Use: Never used  Substance and Sexual Activity   Alcohol use: No   Drug use: No   Sexual activity: Not on file  Other Topics Concern   Not on file  Social History Narrative   Not on file  Social Determinants of Health   Financial Resource Strain: Not on file  Food Insecurity: Not on file  Transportation Needs: Not on file  Physical Activity: Not on file  Stress: Not on file  Social Connections: Not on file  Intimate Partner Violence: Not on file    Vital Signs: Blood pressure 134/72, pulse 62, temperature 97.8 F (36.6 C), resp. rate 16, height 5' 11.5" (1.816 m), weight 207 lb (93.9 kg), SpO2 97 %.  Examination: General Appearance: The patient is well-developed, well-nourished, and in no distress. Skin: Gross inspection of skin unremarkable. Head: normocephalic, no gross  deformities. Eyes: no gross deformities noted. ENT: ears appear grossly normal no exudates. Neck: Supple. No thyromegaly. No LAD. Respiratory: Lungs clear to auscultation bilaterally. Cardiovascular: Normal S1 and S2 without murmur or rub. Extremities: No cyanosis. pulses are equal. Neurologic: Alert and oriented. No involuntary movements.  LABS: No results found for this or any previous visit (from the past 2160 hour(s)).  Radiology: CT Chest High Resolution  Result Date: 10/21/2019 CLINICAL DATA:  Intermittent chest pains for 3-5 years.  COPD. EXAM: CT CHEST WITHOUT CONTRAST TECHNIQUE: Multidetector CT imaging of the chest was performed following the standard protocol without intravenous contrast. High resolution imaging of the lungs, as well as inspiratory and expiratory imaging, was performed. COMPARISON:  None. FINDINGS: Cardiovascular: Heart is at the upper limits of normal in size to mildly enlarged. No pericardial effusion. Mediastinum/Nodes: Subcentimeter low-attenuation lesion in the right lobe of the thyroid. No follow-up recommended (ref: J Am Coll Radiol. 2015 Feb;12(2): 143-50).No pathologically enlarged mediastinal or axillary lymph nodes. Hilar regions are difficult to definitively evaluate without IV contrast. Esophagus is grossly unremarkable. Lungs/Pleura: Mild bibasilar scarring. Negative for subpleural reticulation, traction bronchiectasis/bronchiolectasis, ground-glass, architectural distortion or honeycombing. Image quality is somewhat degraded by respiratory motion. No pleural fluid. Airway is unremarkable. Expiratory phase imaging was not performed in true expiration, limiting the evaluation for air trapping. Upper Abdomen: There may be a vague 1.9 cm low-attenuation lesion in the posteromedial right hepatic lobe. Further characterization is limited without IV contrast. Visualized portions of the liver, gallbladder, adrenal glands, kidneys, spleen, pancreas, stomach and bowel  are otherwise unremarkable. Musculoskeletal: Degenerative changes in the spine. No worrisome lytic or sclerotic lesions. IMPRESSION: No evidence of interstitial lung disease. Electronically Signed   By: Leanna Battles M.D.   On: 10/21/2019 14:42    No results found.  No results found.    Assessment and Plan: Patient Active Problem List   Diagnosis Date Noted   Encounter for general adult medical examination with abnormal findings 10/15/2017   Essential hypertension, benign 10/15/2017   Post traumatic stress disorder 10/15/2017   Dysuria 10/15/2017   Obstructive sleep apnea 04/26/2017    1. Obstructive sleep apnea Continue excellent compliance  2. CPAP use counseling CPAP couseling-Discussed importance of adequate CPAP use as well as proper care and cleaning techniques of machine and all supplies.  3. Essential hypertension Continue current medication and f/u with PCP.    General Counseling: I have discussed the findings of the evaluation and examination with Kevin Fenton.  I have also discussed any further diagnostic evaluation thatmay be needed or ordered today. Dakhari verbalizes understanding of the findings of todays visit. We also reviewed his medications today and discussed drug interactions and side effects including but not limited excessive drowsiness and altered mental states. We also discussed that there is always a risk not just to him but also people around him. he has been encouraged to call  the office with any questions or concerns that should arise related to todays visit.  No orders of the defined types were placed in this encounter.    Time spent: 30  I have personally obtained a history, examined the patient, evaluated laboratory and imaging results, formulated the assessment and plan and placed orders. This patient was seen by Lynn Ito, PA-C in collaboration with Dr. Freda Munro as a part of collaborative care agreement.     Yevonne Pax, MD  [facility] Pulmonary and Critical Care Sleep medicine

## 2022-02-11 ENCOUNTER — Other Ambulatory Visit: Payer: Self-pay | Admitting: Physician Assistant

## 2022-02-11 ENCOUNTER — Telehealth: Payer: Self-pay

## 2022-02-11 DIAGNOSIS — G4733 Obstructive sleep apnea (adult) (pediatric): Secondary | ICD-10-CM

## 2022-02-11 NOTE — Telephone Encounter (Signed)
Spoke with Bradley Gallagher from [facility] about cpap supply  and also Lmom to pt that we already called [facility] and order is ready for them

## 2022-02-21 ENCOUNTER — Ambulatory Visit: Payer: Medicare PPO | Admitting: Internal Medicine

## 2022-03-02 ENCOUNTER — Ambulatory Visit: Payer: Medicare PPO

## 2022-03-16 ENCOUNTER — Ambulatory Visit (INDEPENDENT_AMBULATORY_CARE_PROVIDER_SITE_OTHER): Payer: Medicare PPO

## 2022-03-16 DIAGNOSIS — G4733 Obstructive sleep apnea (adult) (pediatric): Secondary | ICD-10-CM | POA: Diagnosis not present

## 2022-03-16 IMAGING — CT CT CHEST HIGH RESOLUTION W/O CM
2 of 7 series · 14 of 36 positions shown, 17 images · non-contrast
Comparison: None.

CLINICAL DATA: Intermittent chest pains for 3-5 years.  COPD.

EXAM:
CT CHEST WITHOUT CONTRAST
TECHNIQUE: Multidetector CT imaging of the chest was performed following the
standard protocol without intravenous contrast. High resolution
imaging of the lungs, as well as inspiratory and expiratory imaging,
was performed.

[Series 4: thorax 2.00 cor · coronal · 0.55mm/px · 3 of 154 slices shown]
[im 31/154  lung]
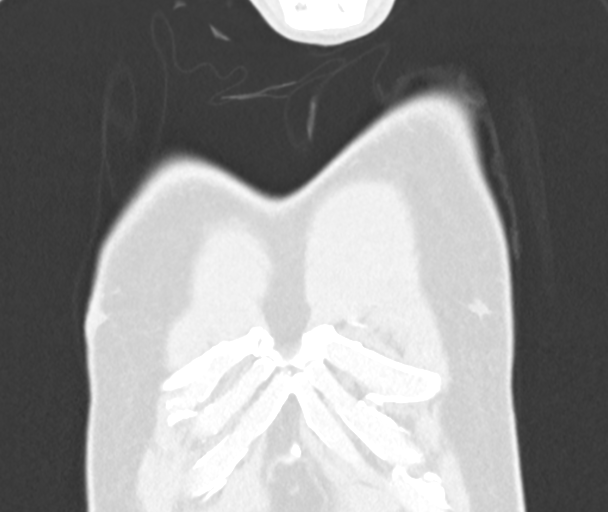
[im 62/154  lung]
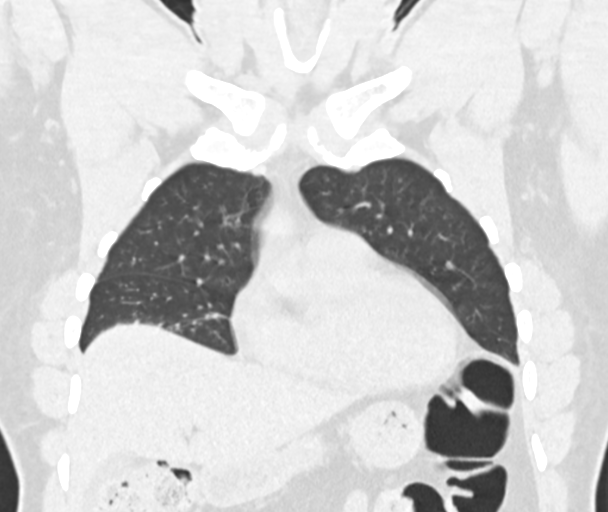
[im 92/154  lung]
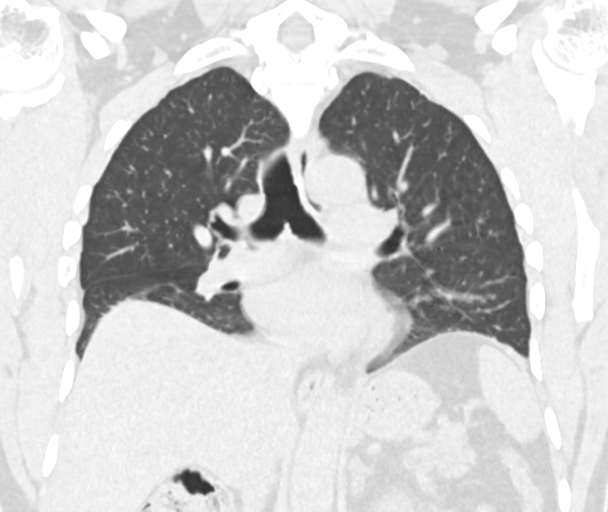

[Series 10: high res (id) thorax 1.00 ax · axial · 0.60mm/px · z∈[-1155,-916]mm · 11 of 283 slices shown, 14 images]
[im 22/283  mediastinal]
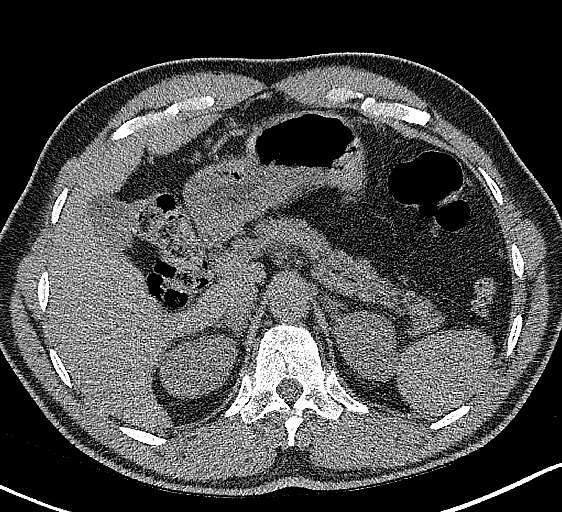
[im 22/283  lung]
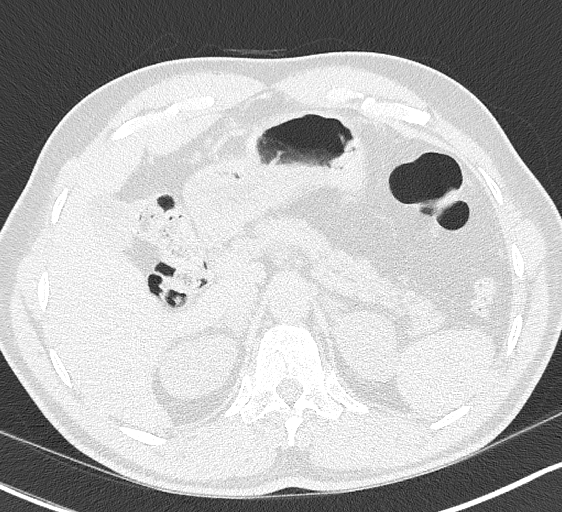
[im 44/283  lung]
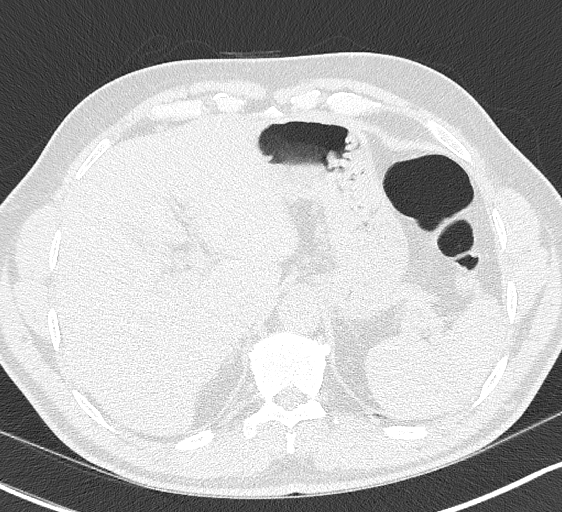
[im 66/283  lung]
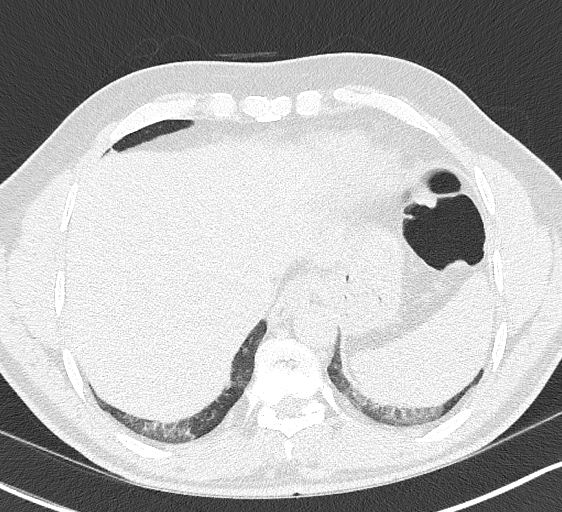
[im 87/283  lung]
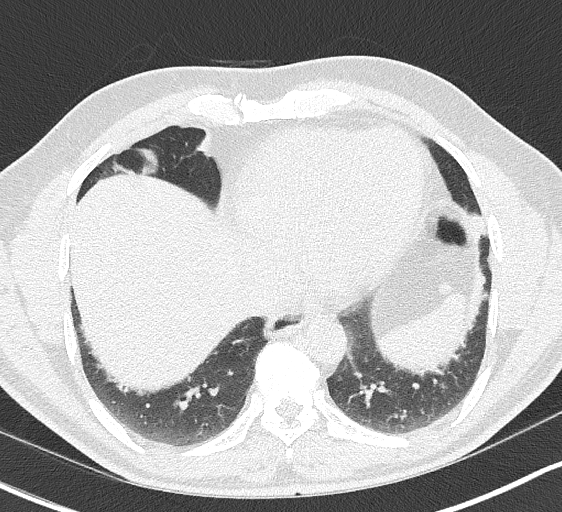
[im 109/283  mediastinal]
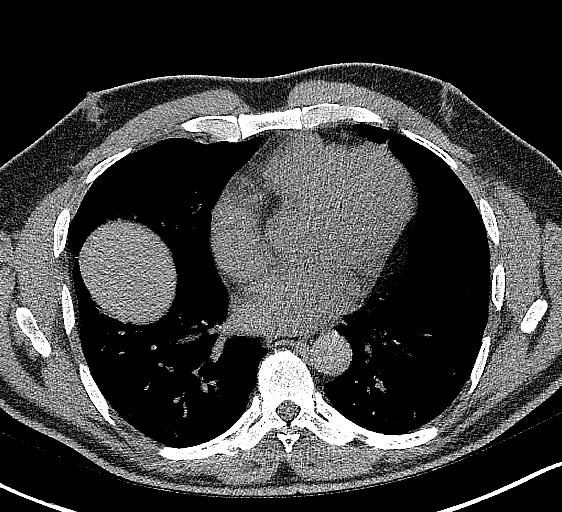
[im 109/283  lung]
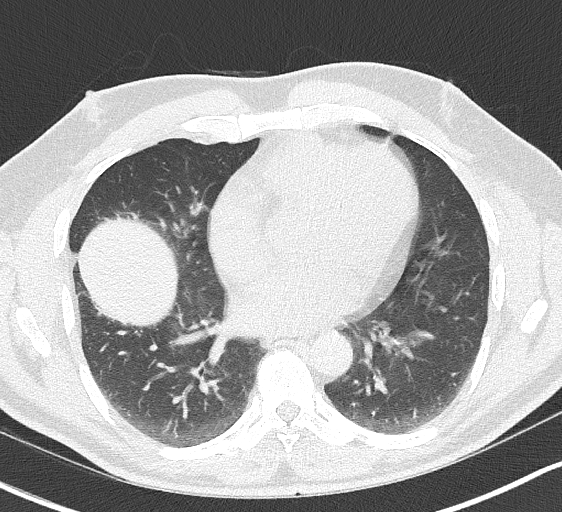
[im 152/283  lung]
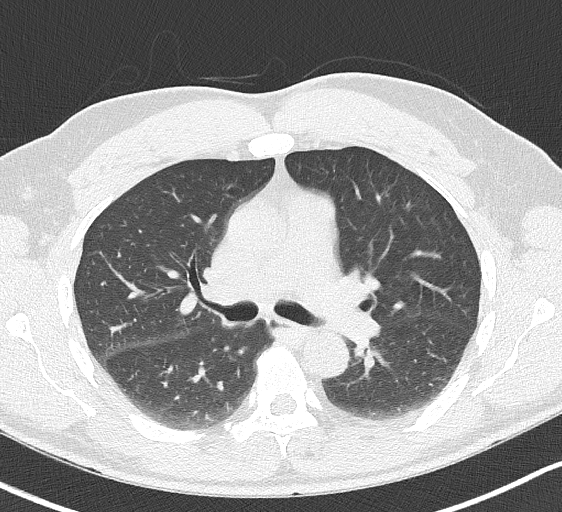
[im 174/283  lung]
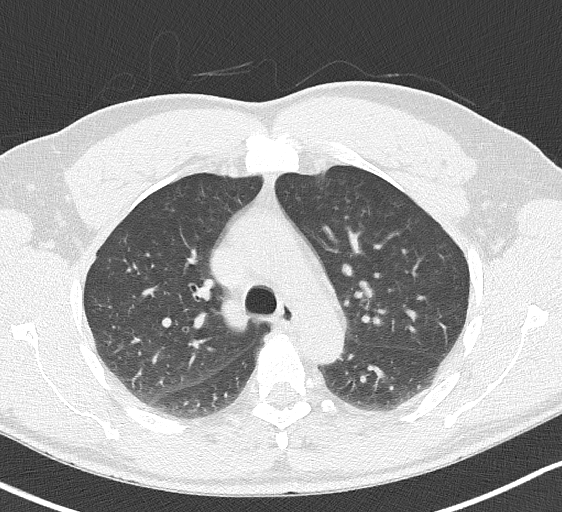
[im 196/283  lung]
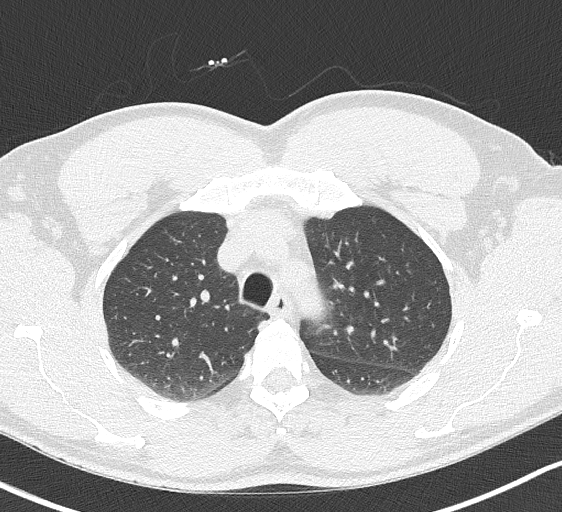
[im 217/283  mediastinal]
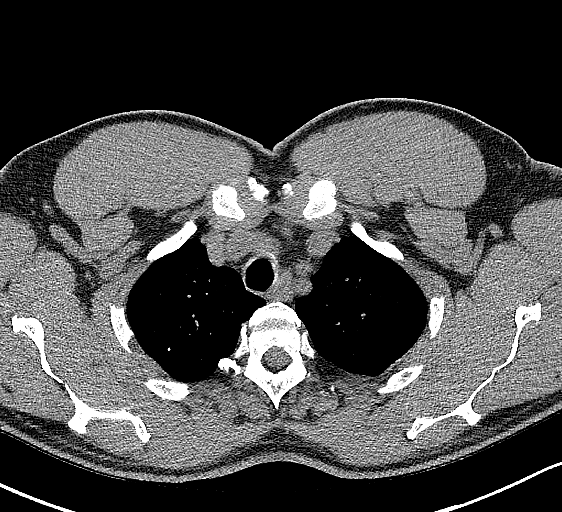
[im 217/283  lung]
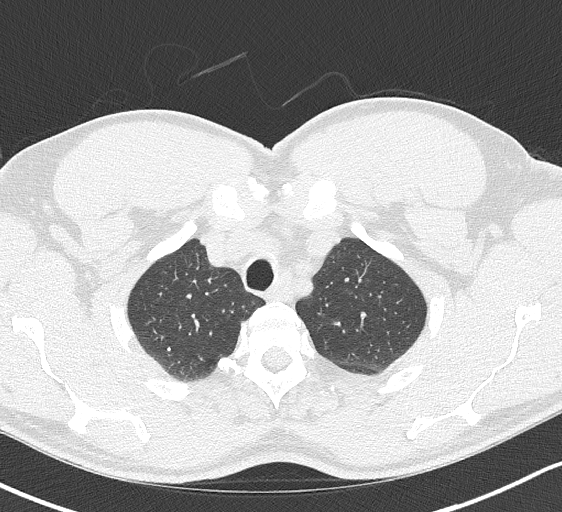
[im 239/283  lung]
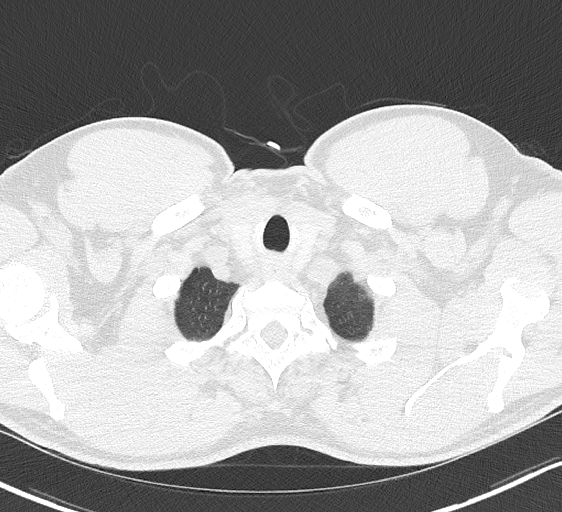
[im 261/283  lung]
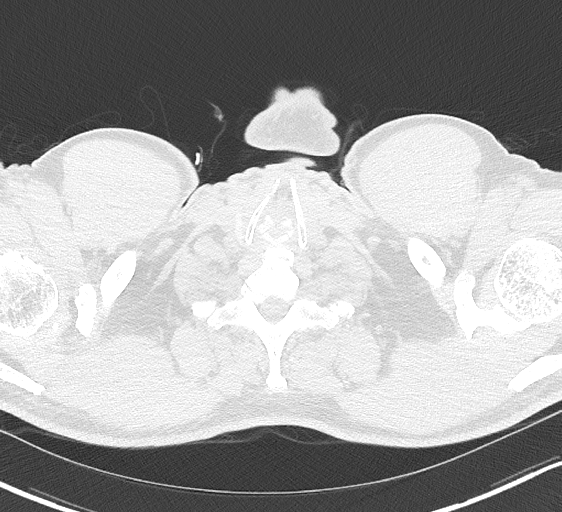

[14 of 36 positions shown; findings below may reference images not displayed]

FINDINGS: Cardiovascular: Heart is at the upper limits of normal in size to
mildly enlarged. No pericardial effusion.

Mediastinum/Nodes: Subcentimeter low-attenuation lesion in the right
lobe of the thyroid. No follow-up recommended (ref: [HOSPITAL]. [DATE]): 143-50).No pathologically enlarged
mediastinal or axillary lymph nodes. Hilar regions are difficult to
definitively evaluate without IV contrast. Esophagus is grossly
unremarkable.

Lungs/Pleura: Mild bibasilar scarring. Negative for subpleural
reticulation, traction bronchiectasis/bronchiolectasis,
ground-glass, architectural distortion or honeycombing. Image
quality is somewhat degraded by respiratory motion. No pleural
fluid. Airway is unremarkable. Expiratory phase imaging was not
performed in true expiration, limiting the evaluation for air
trapping.

Upper Abdomen: There may be a vague 1.9 cm low-attenuation lesion in
the posteromedial right hepatic lobe. Further characterization is
limited without IV contrast. Visualized portions of the liver,
gallbladder, adrenal glands, kidneys, spleen, pancreas, stomach and
bowel are otherwise unremarkable.

Musculoskeletal: Degenerative changes in the spine. No worrisome
lytic or sclerotic lesions.
IMPRESSION: No evidence of interstitial lung disease.

## 2022-03-16 NOTE — Progress Notes (Signed)
95 percentile pressure 9.8   95th percentile leak 18.6   apnea index 0.4 /hr  apnea-hypopnea index  0.7 /hr   total days used  >4 hr 90 days  total days used <4 hr 0 days  Total compliance 100 percent  He is doing great no problems or questions at this time  Pt was seen by Tresa Endo  RRT/RCP  from [facility]

## 2022-04-06 ENCOUNTER — Telehealth: Payer: Self-pay | Admitting: Physician Assistant

## 2022-04-06 NOTE — Telephone Encounter (Signed)
Received [facility] order 04/06/2022. Gave to Lauren to be completed.-nm

## 2022-04-07 ENCOUNTER — Telehealth: Payer: Self-pay | Admitting: Physician Assistant

## 2022-04-07 NOTE — Telephone Encounter (Signed)
Fax back order to [facility]; [phone] & order for [facility]; [phone]

## 2022-04-12 ENCOUNTER — Telehealth: Payer: Self-pay

## 2022-04-12 NOTE — Telephone Encounter (Signed)
Faxed Christella Scheuermann [facility]  [phone] a sleep study and last note pt used [facility] for his cpap supply

## 2022-04-28 ENCOUNTER — Telehealth: Payer: Self-pay

## 2022-04-28 NOTE — Telephone Encounter (Signed)
Faxed again to [place] [phone] sleep study and last note  and its went success and phone no [phone] and spoke with [facility] they they received electronic they will call pt once they received also called pt that faxed and success  fully went through from our  end

## 2022-04-29 ENCOUNTER — Telehealth: Payer: Self-pay

## 2022-04-29 NOTE — Telephone Encounter (Signed)
Spoke with melinda from [facility]  and refaxed note to [place]

## 2022-05-09 ENCOUNTER — Telehealth: Payer: Self-pay

## 2022-05-09 NOTE — Telephone Encounter (Signed)
Spoke with pt he received call from [place] and gave him appt

## 2022-05-20 ENCOUNTER — Telehealth: Payer: Self-pay

## 2022-05-20 NOTE — Telephone Encounter (Signed)
[facility] order signed. Faxed back; [phone]. To be scanned-nm

## 2022-05-20 NOTE — Telephone Encounter (Signed)
Received [facility] home care medical equipment. Gave to [place] for signature-nm

## 2022-06-16 ENCOUNTER — Encounter: Payer: Self-pay | Admitting: Physician Assistant

## 2022-06-16 ENCOUNTER — Ambulatory Visit (INDEPENDENT_AMBULATORY_CARE_PROVIDER_SITE_OTHER): Payer: Medicare PPO | Admitting: Physician Assistant

## 2022-06-16 VITALS — BP 140/80 | HR 77 | Temp 97.8°F | Resp 16 | Ht 71.0 in | Wt 215.0 lb

## 2022-06-16 DIAGNOSIS — I10 Essential (primary) hypertension: Secondary | ICD-10-CM

## 2022-06-16 DIAGNOSIS — Z7189 Other specified counseling: Secondary | ICD-10-CM

## 2022-06-16 DIAGNOSIS — G4733 Obstructive sleep apnea (adult) (pediatric): Secondary | ICD-10-CM | POA: Diagnosis not present

## 2022-06-16 NOTE — Progress Notes (Signed)
[facility] [place], [place] [address]  Pulmonary Sleep Medicine   Office Visit Note  Patient Name: Bradley Gallagher DOB: 03-Mar-1956 MRN [id number]  Date of Service: 06/16/2022  Complaints/HPI: Pt is here for routine pulmonary follow up. He had covid in Sept and left him smelling ammonia-like scent which is still present. He has seen someone at the [place] [facility] for this. He is using new supplier now for CPAP supplies.  Using [place] now. He is wearing nightly and is benefiting from use. Denies any dryness, SOB, or headaches. Changing supplies regularly and keeping everything clean. AHI well controlled.  CPAP Download:  95 percentile pressure 9.8  95th percentile leak 18.6  apnea index 0.4 /hr  apnea-hypopnea index  0.7 /hr  total days used  >4 hr 90 days  total days used <4 hr 0 days Total compliance 100 percent He is doing great no problems or questions at this time        ROS  General: (-) fever, (-) chills, (-) night sweats, (-) weakness Skin: (-) rashes, (-) itching,. Eyes: (-) visual changes, (-) redness, (-) itching. Nose and Sinuses: (-) nasal stuffiness or itchiness, (-) postnasal drip, (-) nosebleeds, (-) sinus trouble. Mouth and Throat: (-) sore throat, (-) hoarseness. Neck: (-) swollen glands, (-) enlarged thyroid, (-) neck pain. Respiratory: - cough, (-) bloody sputum, - shortness of breath, - wheezing. Cardiovascular: - ankle swelling, (-) chest pain. Lymphatic: (-) lymph node enlargement. Neurologic: (-) numbness, (-) tingling. Psychiatric: (-) anxiety, (-) depression   Current Medication: Outpatient Encounter Medications as of 06/16/2022  Medication Sig   ARIPiprazole (ABILIFY) 10 MG tablet Take 10 mg by mouth daily.   aspirin 81 MG tablet Take 81 mg by mouth daily.   cholecalciferol (VITAMIN D) 1000 units tablet Take 1,000 Units by mouth daily.   Dexlansoprazole 30 MG capsule Take 30 mg by mouth daily.   escitalopram (LEXAPRO) 20 MG  tablet Take 20 mg by mouth daily.   folic acid (FOLVITE) 1 MG tablet Take 1 mg by mouth daily.   losartan (COZAAR) 50 MG tablet Take 50 mg by mouth daily.   simvastatin (ZOCOR) 80 MG tablet Take 80 mg by mouth daily.   tamsulosin (FLOMAX) 0.4 MG CAPS capsule Take 0.4 mg by mouth. Take 1 tab po daily   traZODone (DESYREL) 100 MG tablet Take 100 mg by mouth at bedtime.   No facility-administered encounter medications on file as of 06/16/2022.    Surgical History: Past Surgical History:  Procedure Laterality Date   implant in mouth      Medical History: Past Medical History:  Diagnosis Date   Allergy    Sleep apnea     Family History: Family History  Problem Relation Age of Onset   Colon cancer Sister    Heart attack Mother    Dementia Mother     Social History: Social History   Socioeconomic History   Marital status: Married    Spouse name: Not on file   Number of children: Not on file   Years of education: Not on file   Highest education level: Not on file  Occupational History   Not on file  Tobacco Use   Smoking status: Never   Smokeless tobacco: Never  Vaping Use   Vaping Use: Never used  Substance and Sexual Activity   Alcohol use: No   Drug use: No   Sexual activity: Not on file  Other Topics Concern   Not  on file  Social History Narrative   Not on file   Social Determinants of Health   Financial Resource Strain: Not on file  Food Insecurity: Not on file  Transportation Needs: Not on file  Physical Activity: Not on file  Stress: Not on file  Social Connections: Not on file  Intimate Partner Violence: Not on file    Vital Signs: Blood pressure (!) 140/80, pulse 77, temperature 97.8 F (36.6 C), resp. rate 16, height 5\' 11"  (1.803 m), weight 215 lb (97.5 kg), SpO2 98 %.  Examination: General Appearance: The patient is well-developed, well-nourished, and in no distress. Skin: Gross inspection of skin unremarkable. Head: normocephalic, no gross  deformities. Eyes: no gross deformities noted. ENT: ears appear grossly normal no exudates. Neck: Supple. No thyromegaly. No LAD. Respiratory: Lungs clear to auscultation bilaterally. Cardiovascular: Normal S1 and S2 without murmur or rub. Extremities: No cyanosis. pulses are equal. Neurologic: Alert and oriented. No involuntary movements.  LABS: No results found for this or any previous visit (from the past 2160 hour(s)).  Radiology: CT Chest High Resolution  Result Date: 10/21/2019 CLINICAL DATA:  Intermittent chest pains for 3-5 years.  COPD. EXAM: CT CHEST WITHOUT CONTRAST TECHNIQUE: Multidetector CT imaging of the chest was performed following the standard protocol without intravenous contrast. High resolution imaging of the lungs, as well as inspiratory and expiratory imaging, was performed. COMPARISON:  None. FINDINGS: Cardiovascular: Heart is at the upper limits of normal in size to mildly enlarged. No pericardial effusion. Mediastinum/Nodes: Subcentimeter low-attenuation lesion in the right lobe of the thyroid. No follow-up recommended (ref: J Am Coll Radiol. 2015 Feb;12(2): 143-50).No pathologically enlarged mediastinal or axillary lymph nodes. Hilar regions are difficult to definitively evaluate without IV contrast. Esophagus is grossly unremarkable. Lungs/Pleura: Mild bibasilar scarring. Negative for subpleural reticulation, traction bronchiectasis/bronchiolectasis, ground-glass, architectural distortion or honeycombing. Image quality is somewhat degraded by respiratory motion. No pleural fluid. Airway is unremarkable. Expiratory phase imaging was not performed in true expiration, limiting the evaluation for air trapping. Upper Abdomen: There may be a vague 1.9 cm low-attenuation lesion in the posteromedial right hepatic lobe. Further characterization is limited without IV contrast. Visualized portions of the liver, gallbladder, adrenal glands, kidneys, spleen, pancreas, stomach and bowel  are otherwise unremarkable. Musculoskeletal: Degenerative changes in the spine. No worrisome lytic or sclerotic lesions. IMPRESSION: No evidence of interstitial lung disease. Electronically Signed   By: Lorin Picket M.D.   On: 10/21/2019 14:42    No results found.  No results found.    Assessment and Plan: Patient Active Problem List   Diagnosis Date Noted   Encounter for general adult medical examination with abnormal findings 10/15/2017   Essential hypertension, benign 10/15/2017   Post traumatic stress disorder 10/15/2017   Dysuria 10/15/2017   Obstructive sleep apnea 04/26/2017    1. OSA on CPAP Continue excellent compliance  2. CPAP use counseling CPAP couseling-Discussed importance of adequate CPAP use as well as proper care and cleaning techniques of machine and all supplies.  3. Essential hypertension Continue current medication and f/u with PCP.    General Counseling: I have discussed the findings of the evaluation and examination with Awanda Mink.  I have also discussed any further diagnostic evaluation thatmay be needed or ordered today. Rogie verbalizes understanding of the findings of todays visit. We also reviewed his medications today and discussed drug interactions and side effects including but not limited excessive drowsiness and altered mental states. We also discussed that there is always a risk not  just to him but also people around him. he has been encouraged to call the office with any questions or concerns that should arise related to todays visit.  No orders of the defined types were placed in this encounter.    Time spent: 30  I have personally obtained a history, examined the patient, evaluated laboratory and imaging results, formulated the assessment and plan and placed orders. This patient was seen by Drema Dallas, PA-C in collaboration with Dr. Devona Konig as a part of collaborative care agreement.     Allyne Gee, MD [facility] Pulmonary and  Critical Care Sleep medicine

## 2022-09-07 ENCOUNTER — Telehealth: Payer: Self-pay | Admitting: Internal Medicine

## 2022-09-07 NOTE — Telephone Encounter (Signed)
Lvm to move 11/16/22 appt to morning or to another afternoon-Toni

## 2022-09-15 DIAGNOSIS — G4733 Obstructive sleep apnea (adult) (pediatric): Secondary | ICD-10-CM | POA: Diagnosis not present

## 2022-10-13 DIAGNOSIS — J020 Streptococcal pharyngitis: Secondary | ICD-10-CM | POA: Diagnosis not present

## 2022-11-16 ENCOUNTER — Ambulatory Visit (INDEPENDENT_AMBULATORY_CARE_PROVIDER_SITE_OTHER): Payer: Medicare PPO

## 2022-11-16 DIAGNOSIS — G4733 Obstructive sleep apnea (adult) (pediatric): Secondary | ICD-10-CM | POA: Diagnosis not present

## 2022-11-16 NOTE — Progress Notes (Signed)
95 percentile pressure 9.9   95th percentile leak 13.7   apnea index 0.2 /hr  apnea-hypopnea index  0.4 /hr   total days used  >4 hr 30 days  total days used <4 hr 0 days  Total compliance 100 percent  He is doing great no question or problems  Pt was seen by Tresa Endo  RRT/RCP  from [facility]

## 2022-12-15 DIAGNOSIS — G4733 Obstructive sleep apnea (adult) (pediatric): Secondary | ICD-10-CM | POA: Diagnosis not present

## 2022-12-16 ENCOUNTER — Encounter: Payer: Self-pay | Admitting: Physician Assistant

## 2022-12-16 ENCOUNTER — Ambulatory Visit (INDEPENDENT_AMBULATORY_CARE_PROVIDER_SITE_OTHER): Payer: Medicare PPO | Admitting: Physician Assistant

## 2022-12-16 DIAGNOSIS — G4733 Obstructive sleep apnea (adult) (pediatric): Secondary | ICD-10-CM

## 2022-12-16 DIAGNOSIS — Z7189 Other specified counseling: Secondary | ICD-10-CM

## 2022-12-16 DIAGNOSIS — I10 Essential (primary) hypertension: Secondary | ICD-10-CM | POA: Diagnosis not present

## 2022-12-19 ENCOUNTER — Ambulatory Visit: Payer: Medicare PPO | Admitting: Physician Assistant

## 2022-12-23 DIAGNOSIS — R519 Headache, unspecified: Secondary | ICD-10-CM | POA: Diagnosis not present

## 2022-12-23 DIAGNOSIS — R42 Dizziness and giddiness: Secondary | ICD-10-CM | POA: Diagnosis not present

## 2023-02-11 DIAGNOSIS — M5410 Radiculopathy, site unspecified: Secondary | ICD-10-CM | POA: Diagnosis not present

## 2023-03-14 DIAGNOSIS — G4733 Obstructive sleep apnea (adult) (pediatric): Secondary | ICD-10-CM | POA: Diagnosis not present

## 2023-05-12 DIAGNOSIS — M1712 Unilateral primary osteoarthritis, left knee: Secondary | ICD-10-CM | POA: Diagnosis not present

## 2023-05-12 DIAGNOSIS — E785 Hyperlipidemia, unspecified: Secondary | ICD-10-CM | POA: Diagnosis not present

## 2023-05-12 DIAGNOSIS — R7303 Prediabetes: Secondary | ICD-10-CM | POA: Diagnosis not present

## 2023-05-12 DIAGNOSIS — I10 Essential (primary) hypertension: Secondary | ICD-10-CM | POA: Diagnosis not present

## 2023-05-12 DIAGNOSIS — M109 Gout, unspecified: Secondary | ICD-10-CM | POA: Diagnosis not present

## 2023-05-17 ENCOUNTER — Ambulatory Visit: Payer: Medicare PPO

## 2023-05-24 DIAGNOSIS — R7303 Prediabetes: Secondary | ICD-10-CM | POA: Diagnosis not present

## 2023-05-24 DIAGNOSIS — F32A Depression, unspecified: Secondary | ICD-10-CM | POA: Diagnosis not present

## 2023-05-24 DIAGNOSIS — I10 Essential (primary) hypertension: Secondary | ICD-10-CM | POA: Diagnosis not present

## 2023-05-24 DIAGNOSIS — Z0000 Encounter for general adult medical examination without abnormal findings: Secondary | ICD-10-CM | POA: Diagnosis not present

## 2023-05-24 DIAGNOSIS — N401 Enlarged prostate with lower urinary tract symptoms: Secondary | ICD-10-CM | POA: Diagnosis not present

## 2023-05-24 DIAGNOSIS — Z23 Encounter for immunization: Secondary | ICD-10-CM | POA: Diagnosis not present

## 2023-05-24 DIAGNOSIS — M1712 Unilateral primary osteoarthritis, left knee: Secondary | ICD-10-CM | POA: Diagnosis not present

## 2023-05-24 DIAGNOSIS — F4310 Post-traumatic stress disorder, unspecified: Secondary | ICD-10-CM | POA: Diagnosis not present

## 2023-05-24 DIAGNOSIS — E785 Hyperlipidemia, unspecified: Secondary | ICD-10-CM | POA: Diagnosis not present

## 2023-06-12 ENCOUNTER — Ambulatory Visit: Payer: Medicare PPO | Admitting: Physician Assistant

## 2023-06-15 ENCOUNTER — Encounter: Payer: Self-pay | Admitting: Physician Assistant

## 2023-06-15 ENCOUNTER — Ambulatory Visit (INDEPENDENT_AMBULATORY_CARE_PROVIDER_SITE_OTHER): Admitting: Physician Assistant

## 2023-06-15 VITALS — BP 125/70 | HR 84 | Temp 98.4°F | Resp 16 | Ht 71.0 in | Wt 207.4 lb

## 2023-06-15 DIAGNOSIS — Z7189 Other specified counseling: Secondary | ICD-10-CM

## 2023-06-15 DIAGNOSIS — G4733 Obstructive sleep apnea (adult) (pediatric): Secondary | ICD-10-CM | POA: Diagnosis not present

## 2023-06-15 DIAGNOSIS — I10 Essential (primary) hypertension: Secondary | ICD-10-CM | POA: Diagnosis not present

## 2023-06-15 DIAGNOSIS — E785 Hyperlipidemia, unspecified: Secondary | ICD-10-CM | POA: Diagnosis not present

## 2023-06-15 DIAGNOSIS — M549 Dorsalgia, unspecified: Secondary | ICD-10-CM | POA: Diagnosis not present

## 2023-06-15 DIAGNOSIS — R350 Frequency of micturition: Secondary | ICD-10-CM | POA: Diagnosis not present

## 2023-06-15 DIAGNOSIS — M5441 Lumbago with sciatica, right side: Secondary | ICD-10-CM | POA: Diagnosis not present

## 2023-06-15 DIAGNOSIS — N138 Other obstructive and reflux uropathy: Secondary | ICD-10-CM | POA: Diagnosis not present

## 2023-06-15 DIAGNOSIS — N401 Enlarged prostate with lower urinary tract symptoms: Secondary | ICD-10-CM | POA: Diagnosis not present

## 2023-06-15 NOTE — Progress Notes (Signed)
 [facility] [address] [place], [place] [address]  Pulmonary Sleep Medicine   Office Visit Note  Patient Name: Bradley Gallagher DOB: [birth year] MRN [id number]  Date of Service: 06/15/2023  Complaints/HPI: Pt is here for routine pulmonary follow up. Doing well with CPAP and wearing nightly. He is benefiting from use. He does switch between two machines and does well with this. He uses one machine when traveling to second home in [place]. Between both machines his compliance is excellent. Denies any SOB, headaches or dryness. Had some allergy symptoms but otherwise doing well. Changing supplies regularly and keeping machine clean. Uses [employer].   CPAP compliance for both machines:  Airsense 10 [id number]       Airsense 10 [id number] 05/15/23-06/13/23                       05/16/23-06/14/23 Use 18/30    Use 11/30 >4 hours 60%    >4 hours 37% Settings: CPAP @7cm  H2O    Settings: APAP 5-10cm H2O AHI 0.5    AHI 0.6  ROS  General: (-) fever, (-) chills, (-) night sweats, (-) weakness Skin: (-) rashes, (-) itching,. Eyes: (-) visual changes, (-) redness, (-) itching. Nose and Sinuses: (-) nasal stuffiness or itchiness, (-) postnasal drip, (-) nosebleeds, (-) sinus trouble. Mouth and Throat: (-) sore throat, (-) hoarseness. Neck: (-) swollen glands, (-) enlarged thyroid, (-) neck pain. Respiratory: - cough, (-) bloody sputum, - shortness of breath, - wheezing. Cardiovascular: - ankle swelling, (-) chest pain. Lymphatic: (-) lymph node enlargement. Neurologic: (-) numbness, (-) tingling. Psychiatric: (-) anxiety, (-) depression   Current Medication: Outpatient Encounter Medications as of 06/15/2023  Medication Sig   ARIPiprazole (ABILIFY) 10 MG tablet Take 10 mg by mouth daily.   aspirin 81 MG tablet Take 81 mg by mouth daily.   cholecalciferol (VITAMIN D) 1000 units tablet Take 1,000 Units by mouth daily.   Dexlansoprazole 30 MG capsule Take 30 mg by mouth daily.   escitalopram  (LEXAPRO) 20 MG tablet Take 20 mg by mouth daily.   folic acid (FOLVITE) 1 MG tablet Take 1 mg by mouth daily.   losartan (COZAAR) 50 MG tablet Take 50 mg by mouth daily.   magnesium 30 MG tablet Take 30 mg by mouth daily.   simvastatin (ZOCOR) 80 MG tablet Take 80 mg by mouth daily.   tamsulosin (FLOMAX) 0.4 MG CAPS capsule Take 0.4 mg by mouth. Take 1 tab po daily   traZODone (DESYREL) 100 MG tablet Take 100 mg by mouth at bedtime.   Zinc Sulfate (ZINC 15 PO) Take 15 mg by mouth daily.   No facility-administered encounter medications on file as of 06/15/2023.    Surgical History: Past Surgical History:  Procedure Laterality Date   implant in mouth      Medical History: Past Medical History:  Diagnosis Date   Allergy    Sleep apnea     Family History: Family History  Problem Relation Age of Onset   Colon cancer Sister    Heart attack Mother    Dementia Mother     Social History: Social History   Socioeconomic History   Marital status: Married    Spouse name: Not on file   Number of children: Not on file   Years of education: Not on file   Highest education level: Not on file  Occupational History   Not on file  Tobacco Use   Smoking status: Never  Smokeless tobacco: Never  Vaping Use   Vaping status: Never Used  Substance and Sexual Activity   Alcohol use: No   Drug use: No   Sexual activity: Not on file  Other Topics Concern   Not on file  Social History Narrative   Not on file   Social Drivers of Health   Financial Resource Strain: Not on file  Food Insecurity: Not on file  Transportation Needs: Not on file  Physical Activity: Not on file  Stress: Not on file  Social Connections: Not on file  Intimate Partner Violence: Not on file    Vital Signs: Blood pressure 125/70, pulse 84, temperature 98.4 F (36.9 C), resp. rate 16, height 5\' 11"  (1.803 m), weight 207 lb 6.4 oz (94.1 kg), SpO2 97%.  Examination: General Appearance: The patient is  well-developed, well-nourished, and in no distress. Skin: Gross inspection of skin unremarkable. Head: normocephalic, no gross deformities. Eyes: no gross deformities noted. ENT: ears appear grossly normal no exudates. Neck: Supple. No thyromegaly. No LAD. Respiratory: Lungs clear to auscultation. Cardiovascular: Normal S1 and S2 without murmur or rub. Extremities: No cyanosis. pulses are equal. Neurologic: Alert and oriented. No involuntary movements.  LABS: No results found for this or any previous visit (from the past 2160 hours).  Radiology: CT Chest High Resolution Result Date: 10/21/2019 CLINICAL DATA:  Intermittent chest pains for 3-5 years.  COPD. EXAM: CT CHEST WITHOUT CONTRAST TECHNIQUE: Multidetector CT imaging of the chest was performed following the standard protocol without intravenous contrast. High resolution imaging of the lungs, as well as inspiratory and expiratory imaging, was performed. COMPARISON:  None. FINDINGS: Cardiovascular: Heart is at the upper limits of normal in size to mildly enlarged. No pericardial effusion. Mediastinum/Nodes: Subcentimeter low-attenuation lesion in the right lobe of the thyroid. No follow-up recommended (ref: J Am Coll Radiol. 2015 Feb;12(2): 143-50).No pathologically enlarged mediastinal or axillary lymph nodes. Hilar regions are difficult to definitively evaluate without IV contrast. Esophagus is grossly unremarkable. Lungs/Pleura: Mild bibasilar scarring. Negative for subpleural reticulation, traction bronchiectasis/bronchiolectasis, ground-glass, architectural distortion or honeycombing. Image quality is somewhat degraded by respiratory motion. No pleural fluid. Airway is unremarkable. Expiratory phase imaging was not performed in true expiration, limiting the evaluation for air trapping. Upper Abdomen: There may be a vague 1.9 cm low-attenuation lesion in the posteromedial right hepatic lobe. Further characterization is limited without IV  contrast. Visualized portions of the liver, gallbladder, adrenal glands, kidneys, spleen, pancreas, stomach and bowel are otherwise unremarkable. Musculoskeletal: Degenerative changes in the spine. No worrisome lytic or sclerotic lesions. IMPRESSION: No evidence of interstitial lung disease. Electronically Signed   By: Leanna Battles M.D.   On: 10/21/2019 14:42    No results found.  No results found.    Assessment and Plan: Patient Active Problem List   Diagnosis Date Noted   Encounter for general adult medical examination with abnormal findings 10/15/2017   Essential hypertension, benign 10/15/2017   Post traumatic stress disorder 10/15/2017   Dysuria 10/15/2017   Obstructive sleep apnea 04/26/2017    1. OSA on CPAP (Primary) Continue excellent compliance  2. CPAP use counseling CPAP couseling-Discussed importance of adequate CPAP use as well as proper care and cleaning techniques of machine and all supplies.    General Counseling: I have discussed the findings of the evaluation and examination with Bradley Gallagher.  I have also discussed any further diagnostic evaluation thatmay be needed or ordered today. Bradley Gallagher verbalizes understanding of the findings of todays visit. We also reviewed his  medications today and discussed drug interactions and side effects including but not limited excessive drowsiness and altered mental states. We also discussed that there is always a risk not just to him but also people around him. he has been encouraged to call the office with any questions or concerns that should arise related to todays visit.  No orders of the defined types were placed in this encounter.    Time spent: 25  I have personally obtained a history, examined the patient, evaluated laboratory and imaging results, formulated the assessment and plan and placed orders. This patient was seen by Lynn Ito, PA-C in collaboration with Dr. Freda Munro as a part of collaborative care  agreement.     Yevonne Pax, MD [facility] Pulmonary and Critical Care Sleep medicine

## 2023-06-15 NOTE — Patient Instructions (Signed)

## 2023-06-20 DIAGNOSIS — G4733 Obstructive sleep apnea (adult) (pediatric): Secondary | ICD-10-CM | POA: Diagnosis not present

## 2023-09-15 DIAGNOSIS — G4733 Obstructive sleep apnea (adult) (pediatric): Secondary | ICD-10-CM | POA: Diagnosis not present

## 2023-10-26 DIAGNOSIS — I10 Essential (primary) hypertension: Secondary | ICD-10-CM | POA: Diagnosis not present

## 2023-10-26 DIAGNOSIS — H6121 Impacted cerumen, right ear: Secondary | ICD-10-CM | POA: Diagnosis not present

## 2023-10-26 DIAGNOSIS — R42 Dizziness and giddiness: Secondary | ICD-10-CM | POA: Diagnosis not present

## 2023-11-14 DIAGNOSIS — Z125 Encounter for screening for malignant neoplasm of prostate: Secondary | ICD-10-CM | POA: Diagnosis not present

## 2023-11-14 DIAGNOSIS — E785 Hyperlipidemia, unspecified: Secondary | ICD-10-CM | POA: Diagnosis not present

## 2023-11-14 DIAGNOSIS — I10 Essential (primary) hypertension: Secondary | ICD-10-CM | POA: Diagnosis not present

## 2023-11-14 DIAGNOSIS — F4310 Post-traumatic stress disorder, unspecified: Secondary | ICD-10-CM | POA: Diagnosis not present

## 2023-11-14 DIAGNOSIS — M1712 Unilateral primary osteoarthritis, left knee: Secondary | ICD-10-CM | POA: Diagnosis not present

## 2023-11-14 DIAGNOSIS — F32A Depression, unspecified: Secondary | ICD-10-CM | POA: Diagnosis not present

## 2023-11-14 DIAGNOSIS — R7303 Prediabetes: Secondary | ICD-10-CM | POA: Diagnosis not present

## 2023-11-14 DIAGNOSIS — N401 Enlarged prostate with lower urinary tract symptoms: Secondary | ICD-10-CM | POA: Diagnosis not present

## 2023-11-16 DIAGNOSIS — M5134 Other intervertebral disc degeneration, thoracic region: Secondary | ICD-10-CM | POA: Diagnosis not present

## 2023-11-16 DIAGNOSIS — N138 Other obstructive and reflux uropathy: Secondary | ICD-10-CM | POA: Diagnosis not present

## 2023-11-16 DIAGNOSIS — E785 Hyperlipidemia, unspecified: Secondary | ICD-10-CM | POA: Diagnosis not present

## 2023-11-16 DIAGNOSIS — N401 Enlarged prostate with lower urinary tract symptoms: Secondary | ICD-10-CM | POA: Diagnosis not present

## 2023-11-16 DIAGNOSIS — I10 Essential (primary) hypertension: Secondary | ICD-10-CM | POA: Diagnosis not present

## 2023-11-16 DIAGNOSIS — F4310 Post-traumatic stress disorder, unspecified: Secondary | ICD-10-CM | POA: Diagnosis not present

## 2023-11-16 DIAGNOSIS — R7303 Prediabetes: Secondary | ICD-10-CM | POA: Diagnosis not present

## 2023-12-04 ENCOUNTER — Encounter: Payer: Self-pay | Admitting: Internal Medicine

## 2023-12-04 ENCOUNTER — Ambulatory Visit (INDEPENDENT_AMBULATORY_CARE_PROVIDER_SITE_OTHER): Admitting: Internal Medicine

## 2023-12-04 VITALS — BP 126/71 | HR 71 | Temp 98.6°F | Resp 16 | Ht 71.0 in | Wt 206.6 lb

## 2023-12-04 DIAGNOSIS — G4733 Obstructive sleep apnea (adult) (pediatric): Secondary | ICD-10-CM

## 2023-12-04 DIAGNOSIS — Z7189 Other specified counseling: Secondary | ICD-10-CM | POA: Diagnosis not present

## 2023-12-04 NOTE — Progress Notes (Signed)
 [facility] [address] [place], [place] [address]  Pulmonary Sleep Medicine   Office Visit Note  Patient Name: Bradley Gallagher DOB: October 10, 1955 MRN [id number]  Date of Service: 12/04/2023  Complaints/HPI: He is doing well. He has a 100% compliance but he has two machines and divides the time between the two machines. His AHI 0.6 per hour. Patient has been feeling good with the machine. He states he is doing better with the flashbacks from the usage of the machines  Office Spirometry Results:     ROS  General: (-) fever, (-) chills, (-) night sweats, (-) weakness Skin: (-) rashes, (-) itching,. Eyes: (-) visual changes, (-) redness, (-) itching. Nose and Sinuses: (-) nasal stuffiness or itchiness, (-) postnasal drip, (-) nosebleeds, (-) sinus trouble. Mouth and Throat: (-) sore throat, (-) hoarseness. Neck: (-) swollen glands, (-) enlarged thyroid, (-) neck pain. Respiratory: - cough, (-) bloody sputum, - shortness of breath, - wheezing. Cardiovascular: - ankle swelling, (-) chest pain. Lymphatic: (-) lymph node enlargement. Neurologic: (-) numbness, (-) tingling. Psychiatric: (-) anxiety, (-) depression   Current Medication: Outpatient Encounter Medications as of 12/04/2023  Medication Sig   ARIPiprazole (ABILIFY) 10 MG tablet Take 10 mg by mouth daily.   aspirin 81 MG tablet Take 81 mg by mouth daily.   cholecalciferol (VITAMIN D) 1000 units tablet Take 1,000 Units by mouth daily.   Dexlansoprazole 30 MG capsule Take 30 mg by mouth daily.   escitalopram (LEXAPRO) 20 MG tablet Take 20 mg by mouth daily.   folic acid (FOLVITE) 1 MG tablet Take 1 mg by mouth daily.   losartan (COZAAR) 50 MG tablet Take 50 mg by mouth daily.   magnesium 30 MG tablet Take 30 mg by mouth daily.   simvastatin (ZOCOR) 80 MG tablet Take 80 mg by mouth daily.   tamsulosin (FLOMAX) 0.4 MG CAPS capsule Take 0.4 mg by mouth. Take 1 tab po daily   traZODone (DESYREL) 100 MG tablet Take 100 mg  by mouth at bedtime.   Zinc Sulfate (ZINC 15 PO) Take 15 mg by mouth daily.   No facility-administered encounter medications on file as of 12/04/2023.    Surgical History: Past Surgical History:  Procedure Laterality Date   implant in mouth      Medical History: Past Medical History:  Diagnosis Date   Allergy    Sleep apnea     Family History: Family History  Problem Relation Age of Onset   Colon cancer Sister    Heart attack Mother    Dementia Mother     Social History: Social History   Socioeconomic History   Marital status: Married    Spouse name: Not on file   Number of children: Not on file   Years of education: Not on file   Highest education level: Not on file  Occupational History   Not on file  Tobacco Use   Smoking status: Never   Smokeless tobacco: Never  Vaping Use   Vaping status: Never Used  Substance and Sexual Activity   Alcohol use: No   Drug use: No   Sexual activity: Not on file  Other Topics Concern   Not on file  Social History Narrative   Not on file   Social Drivers of Health   Financial Resource Strain: Not on file  Food Insecurity: Not on file  Transportation Needs: Not on file  Physical Activity: Not on file  Stress: Not on file  Social Connections: Not  on file  Intimate Partner Violence: Not on file    Vital Signs: Blood pressure 126/71, pulse 71, temperature 98.6 F (37 C), resp. rate 16, height 5' 11 (1.803 m), weight 206 lb 9.6 oz (93.7 kg), SpO2 98%.  Examination: General Appearance: The patient is well-developed, well-nourished, and in no distress. Skin: Gross inspection of skin unremarkable. Head: normocephalic, no gross deformities. Eyes: no gross deformities noted. ENT: ears appear grossly normal no exudates. Neck: Supple. No thyromegaly. No LAD. Respiratory: no rhonchi noted. Cardiovascular: Normal S1 and S2 without murmur or rub. Extremities: No cyanosis. pulses are equal. Neurologic: Alert and oriented.  No involuntary movements.  LABS: No results found for this or any previous visit (from the past 2160 hours).  Radiology: CT Chest High Resolution Result Date: 10/21/2019 CLINICAL DATA:  Intermittent chest pains for 3-5 years.  COPD. EXAM: CT CHEST WITHOUT CONTRAST TECHNIQUE: Multidetector CT imaging of the chest was performed following the standard protocol without intravenous contrast. High resolution imaging of the lungs, as well as inspiratory and expiratory imaging, was performed. COMPARISON:  None. FINDINGS: Cardiovascular: Heart is at the upper limits of normal in size to mildly enlarged. No pericardial effusion. Mediastinum/Nodes: Subcentimeter low-attenuation lesion in the right lobe of the thyroid. No follow-up recommended (ref: J Am Coll Radiol. 2015 Feb;12(2): 143-50).No pathologically enlarged mediastinal or axillary lymph nodes. Hilar regions are difficult to definitively evaluate without IV contrast. Esophagus is grossly unremarkable. Lungs/Pleura: Mild bibasilar scarring. Negative for subpleural reticulation, traction bronchiectasis/bronchiolectasis, ground-glass, architectural distortion or honeycombing. Image quality is somewhat degraded by respiratory motion. No pleural fluid. Airway is unremarkable. Expiratory phase imaging was not performed in true expiration, limiting the evaluation for air trapping. Upper Abdomen: There may be a vague 1.9 cm low-attenuation lesion in the posteromedial right hepatic lobe. Further characterization is limited without IV contrast. Visualized portions of the liver, gallbladder, adrenal glands, kidneys, spleen, pancreas, stomach and bowel are otherwise unremarkable. Musculoskeletal: Degenerative changes in the spine. No worrisome lytic or sclerotic lesions. IMPRESSION: No evidence of interstitial lung disease. Electronically Signed   By: Newell Eke M.D.   On: 10/21/2019 14:42    No results found.  No results found.  Assessment and Plan: Patient  Active Problem List   Diagnosis Date Noted   Encounter for general adult medical examination with abnormal findings 10/15/2017   Essential hypertension, benign 10/15/2017   Post traumatic stress disorder 10/15/2017   Dysuria 10/15/2017   Obstructive sleep apnea 04/26/2017    1. OSA on CPAP (Primary) Great compliance at a 100% will continue with current pressures. His machine is up to date  2. CPAP use counseling CPAP Counseling: had a lengthy discussion with the patient regarding the importance of PAP therapy in management of the sleep apnea. Patient appears to understand the risk factor reduction and also understands the risks associated with untreated sleep apnea.    General Counseling: I have discussed the findings of the evaluation and examination with Maple.  I have also discussed any further diagnostic evaluation thatmay be needed or ordered today. Dmarius verbalizes understanding of the findings of todays visit. We also reviewed his medications today and discussed drug interactions and side effects including but not limited excessive drowsiness and altered mental states. We also discussed that there is always a risk not just to him but also people around him. he has been encouraged to call the office with any questions or concerns that should arise related to todays visit.  No orders of the defined types  were placed in this encounter.    Time spent: 19  I have personally obtained a history, examined the patient, evaluated laboratory and imaging results, formulated the assessment and plan and placed orders.    Elfreda DELENA Bathe, MD [facility] Pulmonary and Critical Care Sleep medicine

## 2023-12-05 ENCOUNTER — Ambulatory Visit: Admitting: Internal Medicine

## 2023-12-08 DIAGNOSIS — G4733 Obstructive sleep apnea (adult) (pediatric): Secondary | ICD-10-CM | POA: Diagnosis not present

## 2023-12-14 ENCOUNTER — Ambulatory Visit: Admitting: Physician Assistant

## 2024-02-15 DIAGNOSIS — R350 Frequency of micturition: Secondary | ICD-10-CM | POA: Diagnosis not present

## 2024-12-09 ENCOUNTER — Ambulatory Visit: Admitting: Internal Medicine
